# Patient Record
Sex: Female | Born: 1990 | Race: White | Hispanic: No | Marital: Single | Smoking: Current every day smoker
Health system: Southern US, Community
[De-identification: ages and names within clinical notes are randomized; demographics above are authoritative.]

## PROBLEM LIST (undated history)

## (undated) ENCOUNTER — Emergency Department (HOSPITAL_COMMUNITY): Payer: Medicaid Other

## (undated) DIAGNOSIS — F32A Depression, unspecified: Secondary | ICD-10-CM

## (undated) DIAGNOSIS — F329 Major depressive disorder, single episode, unspecified: Secondary | ICD-10-CM

## (undated) DIAGNOSIS — F39 Unspecified mood [affective] disorder: Secondary | ICD-10-CM

## (undated) DIAGNOSIS — F419 Anxiety disorder, unspecified: Secondary | ICD-10-CM

## (undated) DIAGNOSIS — E785 Hyperlipidemia, unspecified: Secondary | ICD-10-CM

## (undated) DIAGNOSIS — F603 Borderline personality disorder: Secondary | ICD-10-CM

## (undated) HISTORY — PX: OTHER SURGICAL HISTORY: SHX169

## (undated) HISTORY — PX: BLADDER SURGERY: SHX569

---

## 2009-12-26 ENCOUNTER — Emergency Department: Payer: Self-pay | Admitting: Emergency Medicine

## 2010-01-03 ENCOUNTER — Emergency Department: Payer: Self-pay | Admitting: Emergency Medicine

## 2010-01-17 ENCOUNTER — Ambulatory Visit: Payer: Self-pay | Admitting: Urology

## 2010-01-27 ENCOUNTER — Emergency Department: Payer: Self-pay | Admitting: Emergency Medicine

## 2010-02-24 ENCOUNTER — Emergency Department: Payer: Self-pay | Admitting: Emergency Medicine

## 2010-04-01 ENCOUNTER — Inpatient Hospital Stay: Payer: Self-pay | Admitting: Psychiatry

## 2010-05-08 ENCOUNTER — Emergency Department: Payer: Self-pay | Admitting: Emergency Medicine

## 2010-05-31 ENCOUNTER — Ambulatory Visit: Payer: Self-pay | Admitting: Urology

## 2010-06-09 ENCOUNTER — Emergency Department: Payer: Self-pay | Admitting: Emergency Medicine

## 2010-09-09 ENCOUNTER — Emergency Department: Payer: Self-pay | Admitting: Emergency Medicine

## 2010-11-23 ENCOUNTER — Inpatient Hospital Stay: Payer: Self-pay | Admitting: Psychiatry

## 2011-03-25 ENCOUNTER — Emergency Department (HOSPITAL_COMMUNITY)
Admission: EM | Admit: 2011-03-25 | Discharge: 2011-03-25 | Disposition: A | Discharge status: Home / Self Care | Payer: Medicaid Other | Attending: Emergency Medicine | Admitting: Emergency Medicine

## 2011-03-25 DIAGNOSIS — J45909 Unspecified asthma, uncomplicated: Secondary | ICD-10-CM | POA: Insufficient documentation

## 2011-03-25 DIAGNOSIS — S61509A Unspecified open wound of unspecified wrist, initial encounter: Secondary | ICD-10-CM | POA: Insufficient documentation

## 2011-03-25 DIAGNOSIS — X789XXA Intentional self-harm by unspecified sharp object, initial encounter: Secondary | ICD-10-CM | POA: Insufficient documentation

## 2011-03-25 DIAGNOSIS — F17200 Nicotine dependence, unspecified, uncomplicated: Secondary | ICD-10-CM | POA: Insufficient documentation

## 2011-03-25 DIAGNOSIS — Z79899 Other long term (current) drug therapy: Secondary | ICD-10-CM | POA: Insufficient documentation

## 2011-03-25 LAB — CBC
HCT: 43 % (ref 36.0–46.0)
Hemoglobin: 14 g/dL (ref 12.0–15.0)
MCH: 28.2 pg (ref 26.0–34.0)
MCHC: 32.6 g/dL (ref 30.0–36.0)
MCV: 86.5 fL (ref 78.0–100.0)

## 2011-03-25 LAB — COMPREHENSIVE METABOLIC PANEL
AST: 18 U/L (ref 0–37)
Albumin: 3.8 g/dL (ref 3.5–5.2)
Alkaline Phosphatase: 96 U/L (ref 39–117)
Chloride: 108 mEq/L (ref 96–112)
Creatinine, Ser: 0.81 mg/dL (ref 0.4–1.2)
Potassium: 3.5 mEq/L (ref 3.5–5.1)
Total Bilirubin: 0.1 mg/dL — ABNORMAL LOW (ref 0.3–1.2)

## 2011-03-25 LAB — DIFFERENTIAL
Lymphocytes Relative: 26 % (ref 12–46)
Monocytes Absolute: 0.5 10*3/uL (ref 0.1–1.0)
Monocytes Relative: 6 % (ref 3–12)
Neutro Abs: 6.1 10*3/uL (ref 1.7–7.7)

## 2011-03-25 LAB — RAPID URINE DRUG SCREEN, HOSP PERFORMED
Barbiturates: NOT DETECTED
Tetrahydrocannabinol: NOT DETECTED

## 2011-05-11 ENCOUNTER — Emergency Department: Payer: Self-pay | Admitting: Emergency Medicine

## 2011-05-13 ENCOUNTER — Ambulatory Visit: Payer: Self-pay | Admitting: Urology

## 2011-05-15 LAB — PATHOLOGY REPORT

## 2011-05-22 ENCOUNTER — Emergency Department: Payer: Self-pay | Admitting: Emergency Medicine

## 2011-08-05 ENCOUNTER — Emergency Department (HOSPITAL_COMMUNITY)
Admission: EM | Admit: 2011-08-05 | Discharge: 2011-08-06 | Payer: Medicaid Other | Attending: Emergency Medicine | Admitting: Emergency Medicine

## 2011-08-05 DIAGNOSIS — S61509A Unspecified open wound of unspecified wrist, initial encounter: Secondary | ICD-10-CM | POA: Insufficient documentation

## 2011-08-05 DIAGNOSIS — X58XXXA Exposure to other specified factors, initial encounter: Secondary | ICD-10-CM | POA: Insufficient documentation

## 2011-08-07 ENCOUNTER — Emergency Department (HOSPITAL_COMMUNITY)
Admission: EM | Admit: 2011-08-07 | Discharge: 2011-08-07 | Disposition: A | Discharge status: Home / Self Care | Payer: Medicaid Other | Attending: Emergency Medicine | Admitting: Emergency Medicine

## 2011-08-07 DIAGNOSIS — F39 Unspecified mood [affective] disorder: Secondary | ICD-10-CM | POA: Insufficient documentation

## 2011-08-07 DIAGNOSIS — E785 Hyperlipidemia, unspecified: Secondary | ICD-10-CM | POA: Insufficient documentation

## 2011-08-07 DIAGNOSIS — Z046 Encounter for general psychiatric examination, requested by authority: Secondary | ICD-10-CM | POA: Insufficient documentation

## 2011-08-07 DIAGNOSIS — E669 Obesity, unspecified: Secondary | ICD-10-CM | POA: Insufficient documentation

## 2011-08-07 DIAGNOSIS — F1910 Other psychoactive substance abuse, uncomplicated: Secondary | ICD-10-CM | POA: Insufficient documentation

## 2011-08-07 DIAGNOSIS — J45909 Unspecified asthma, uncomplicated: Secondary | ICD-10-CM | POA: Insufficient documentation

## 2011-08-07 DIAGNOSIS — F603 Borderline personality disorder: Secondary | ICD-10-CM | POA: Insufficient documentation

## 2011-08-07 LAB — DIFFERENTIAL
Basophils Relative: 0 % (ref 0–1)
Eosinophils Absolute: 0 10*3/uL (ref 0.0–0.7)
Eosinophils Relative: 1 % (ref 0–5)
Monocytes Relative: 7 % (ref 3–12)
Neutrophils Relative %: 70 % (ref 43–77)

## 2011-08-07 LAB — COMPREHENSIVE METABOLIC PANEL
ALT: 36 U/L — ABNORMAL HIGH (ref 0–35)
AST: 24 U/L (ref 0–37)
Albumin: 3.6 g/dL (ref 3.5–5.2)
CO2: 23 mEq/L (ref 19–32)
Calcium: 9.6 mg/dL (ref 8.4–10.5)
Sodium: 139 mEq/L (ref 135–145)
Total Protein: 7.2 g/dL (ref 6.0–8.3)

## 2011-08-07 LAB — RAPID URINE DRUG SCREEN, HOSP PERFORMED
Amphetamines: NOT DETECTED
Benzodiazepines: NOT DETECTED
Cocaine: NOT DETECTED

## 2011-08-07 LAB — POCT PREGNANCY, URINE: Preg Test, Ur: NEGATIVE

## 2011-08-07 LAB — CBC
MCH: 28.7 pg (ref 26.0–34.0)
MCHC: 32.7 g/dL (ref 30.0–36.0)
Platelets: 261 10*3/uL (ref 150–400)
RBC: 4.87 MIL/uL (ref 3.87–5.11)
RDW: 13.2 % (ref 11.5–15.5)

## 2011-08-08 ENCOUNTER — Emergency Department (HOSPITAL_COMMUNITY)
Admission: EM | Admit: 2011-08-08 | Discharge: 2011-08-08 | Disposition: A | Discharge status: Home / Self Care | Payer: Medicaid Other | Attending: Emergency Medicine | Admitting: Emergency Medicine

## 2011-08-08 DIAGNOSIS — X58XXXA Exposure to other specified factors, initial encounter: Secondary | ICD-10-CM | POA: Insufficient documentation

## 2011-08-08 DIAGNOSIS — S61509A Unspecified open wound of unspecified wrist, initial encounter: Secondary | ICD-10-CM | POA: Insufficient documentation

## 2011-08-08 DIAGNOSIS — Z09 Encounter for follow-up examination after completed treatment for conditions other than malignant neoplasm: Secondary | ICD-10-CM | POA: Insufficient documentation

## 2011-08-11 ENCOUNTER — Emergency Department (HOSPITAL_COMMUNITY)
Admission: EM | Admit: 2011-08-11 | Discharge: 2011-08-12 | Disposition: A | Discharge status: Nursing Home (Includes ICF) | Payer: Medicaid Other | Source: Home / Self Care | Attending: Emergency Medicine | Admitting: Emergency Medicine

## 2011-08-11 DIAGNOSIS — Z79899 Other long term (current) drug therapy: Secondary | ICD-10-CM | POA: Insufficient documentation

## 2011-08-11 DIAGNOSIS — T191XXA Foreign body in bladder, initial encounter: Secondary | ICD-10-CM | POA: Insufficient documentation

## 2011-08-11 DIAGNOSIS — T190XXA Foreign body in urethra, initial encounter: Secondary | ICD-10-CM | POA: Insufficient documentation

## 2011-08-11 DIAGNOSIS — E785 Hyperlipidemia, unspecified: Secondary | ICD-10-CM | POA: Insufficient documentation

## 2011-08-11 DIAGNOSIS — IMO0002 Reserved for concepts with insufficient information to code with codable children: Secondary | ICD-10-CM | POA: Insufficient documentation

## 2011-08-11 LAB — URINALYSIS, ROUTINE W REFLEX MICROSCOPIC
Glucose, UA: NEGATIVE mg/dL
Specific Gravity, Urine: 1.018 (ref 1.005–1.030)
pH: 6.5 (ref 5.0–8.0)

## 2011-08-11 LAB — URINE MICROSCOPIC-ADD ON

## 2011-08-12 ENCOUNTER — Inpatient Hospital Stay (HOSPITAL_COMMUNITY)
Admission: AD | Admit: 2011-08-12 | Discharge: 2011-08-17 | DRG: 700 | Disposition: A | Discharge status: Other Acute Inpatient Hospital | Payer: Medicaid Other | Source: Other Acute Inpatient Hospital | Attending: Urology | Admitting: Urology

## 2011-08-12 ENCOUNTER — Other Ambulatory Visit: Payer: Self-pay | Admitting: Urology

## 2011-08-12 DIAGNOSIS — F3112 Bipolar disorder, current episode manic without psychotic features, moderate: Secondary | ICD-10-CM

## 2011-08-12 DIAGNOSIS — IMO0002 Reserved for concepts with insufficient information to code with codable children: Secondary | ICD-10-CM | POA: Diagnosis present

## 2011-08-12 DIAGNOSIS — J45909 Unspecified asthma, uncomplicated: Secondary | ICD-10-CM | POA: Diagnosis present

## 2011-08-12 DIAGNOSIS — F603 Borderline personality disorder: Secondary | ICD-10-CM | POA: Diagnosis present

## 2011-08-12 DIAGNOSIS — E785 Hyperlipidemia, unspecified: Secondary | ICD-10-CM | POA: Diagnosis present

## 2011-08-12 DIAGNOSIS — T190XXA Foreign body in urethra, initial encounter: Principal | ICD-10-CM | POA: Diagnosis present

## 2011-08-12 DIAGNOSIS — F489 Nonpsychotic mental disorder, unspecified: Secondary | ICD-10-CM | POA: Diagnosis present

## 2011-08-12 HISTORY — DX: Major depressive disorder, single episode, unspecified: F32.9

## 2011-08-12 HISTORY — DX: Depression, unspecified: F32.A

## 2011-08-12 HISTORY — DX: Hyperlipidemia, unspecified: E78.5

## 2011-08-12 LAB — MRSA PCR SCREENING: MRSA by PCR: NEGATIVE

## 2011-08-13 LAB — LIPID PANEL
LDL Cholesterol: 137 mg/dL — ABNORMAL HIGH (ref 0–99)
Total CHOL/HDL Ratio: 4.9 RATIO
Triglycerides: 55 mg/dL (ref <150)
VLDL: 11 mg/dL (ref 0–40)

## 2011-08-14 NOTE — Consult Note (Signed)
Cynthia Phelps, LEANOS NO.:  [id number]  MEDICAL RECORD NO.:  [id number]  LOCATION:  1415                         FACILITY:  [facility]  PHYSICIAN:  Eulogio Ditch, MD DATE OF BIRTH:  [birth year]  DATE OF CONSULTATION:  08/12/2011 DATE OF DISCHARGE:                                CONSULTATION   REASON FOR CONSULT:  Psych history, the patient put foreign body into the bladder.  HISTORY OF PRESENT ILLNESS:  A 20 year old, Caucasian female, who has a history of borderline personality disorder and bipolar disorder.  The patient became upset with her father last night and pushed a cap of the marker through her urethra into her bladder.  The patient reported that she wanted to go to home for the weekend as she lives in a group home and father did not want to take her so that is why she became upset. The patient was seen at [facility] on August 06, 2011, they recommended inpatient psych hospitalization at [facility].  The patient verbalized suicidal ideation at that time, with a plan to overdose, to cut her wrist, or insert a foreign object into her bladder and later on, she did that.  The patient is on Clozaril 200 mg, but she is noncompliant with this medication for almost 3 weeks and she told me that she does not like this medication as it makes her sleepy.  The patient also takes Celexa 20 mg p.o. daily.  PAST MEDICAL HISTORY: 1. History of asthma. 2. Hyperlipidemia.  ALLERGIES:  The patient is allergic to; 1. LATEX. 2. HALDOL. 3. DOXYCYCLINE. 4. TETRACYCLINES.  SUBSTANCE ABUSE HISTORY:  The patient has a history of polysubstance dependence, but currently reported that she is sober from the drugs and alcohol for almost 1 year.  PAST PSYCHIATRIC HISTORY:  The patient has multiple admissions in the past.  She was in the [facility] 2 months ago for almost 2 months.  The patient has been in the psych hospital since the age of 83.  Currently, she is followed  at [facility] for her psychiatric/mental health issues.  MENTAL STATUS EXAMINATION:  The patient is calm, cooperative during interview.  Fair eye contact.  No abnormal movements noticed.  Speech normal in rate, rhythm, and volume.  Mood, depressed as per her affect. Mood, congruent.  Thought process; she is logical and goal-directed, not hallucinating or delusional.  Not internally preoccupied.  She denies currently being suicidal or homicidal.  Cognition; alert, awake, and oriented x3.  Memory; immediate, recent, remote fair.  Attention and concentration, fair.  Abstraction ability, fair.  Insight and judgment, poor.  DIAGNOSES:  Axis I:  Mood disorder, NOS.  Rule out bipolar disorder. Axis II:  Borderline personality disorder.  Axis III:  See medical notes. Axis IV:  Chronic mental health issues. Axis V:  40-50.  RECOMMENDATIONS: 1. As the patient is noncompliant with Clozaril, I will not start her     back on this medication and she do not want to be on this     medication.  The patient agreed to take Geodon 40 mg twice a day.    Geodon will be a good medication for  her because of less weight     gain as compared to other medications in her case.  The patient     also wants to move to the inpatient psych hospital at [facility], but I     told her that I will start the medication and we will see how she     does on that and also in the meanwhile, we will refer her to the     [facility].  The patient agrees with this treatment plan. 2. Social worker should call the group home to get more collateral     information on the patient.     Eulogio Ditch, MD     SA/MEDQ  D:  08/12/2011  T:  08/12/2011  Job:  [id number]  Electronically Signed by Eulogio Ditch  on 08/14/2011 12:13:01 PM

## 2011-08-16 DIAGNOSIS — F39 Unspecified mood [affective] disorder: Secondary | ICD-10-CM

## 2011-08-16 DIAGNOSIS — F603 Borderline personality disorder: Secondary | ICD-10-CM

## 2011-08-16 MED ORDER — LORAZEPAM 1 MG PO TABS: 2.0000 mg | ORAL_TABLET | Freq: Every day | ORAL | Status: DC

## 2011-08-16 MED ORDER — CITALOPRAM HYDROBROMIDE 20 MG PO TABS
20.0000 mg | ORAL_TABLET | Freq: Every day | ORAL | Status: DC
  Administered 2011-08-17: 20 mg via ORAL
  Filled 2011-08-16 (×3): qty 1

## 2011-08-16 MED ORDER — MORPHINE SULFATE 2 MG/ML IJ SOLN: 2.0000 mg | INTRAMUSCULAR | Status: DC | PRN

## 2011-08-16 MED ORDER — CIPROFLOXACIN HCL 500 MG PO TABS
500.0000 mg | ORAL_TABLET | Freq: Two times a day (BID) | ORAL | Status: DC
  Administered 2011-08-17: 500 mg via ORAL
  Filled 2011-08-16 (×2): qty 1

## 2011-08-16 MED ORDER — DOCUSATE SODIUM 100 MG PO CAPS
100.0000 mg | ORAL_CAPSULE | Freq: Two times a day (BID) | ORAL | Status: DC
  Administered 2011-08-17: 100 mg via ORAL
  Filled 2011-08-16 (×6): qty 1

## 2011-08-16 MED ORDER — NICOTINE 14 MG/24HR TD PT24
14.0000 mg | MEDICATED_PATCH | Freq: Every day | TRANSDERMAL | Status: DC
  Filled 2011-08-16 (×2): qty 1

## 2011-08-16 MED ORDER — TIOTROPIUM BROMIDE MONOHYDRATE 18 MCG IN CAPS
18.0000 ug | ORAL_CAPSULE | Freq: Every day | RESPIRATORY_TRACT | Status: DC
  Administered 2011-08-17: 18 ug via RESPIRATORY_TRACT
  Filled 2011-08-16: qty 5

## 2011-08-16 MED ORDER — NICOTINE 7 MG/24HR TD PT24
7.0000 mg | MEDICATED_PATCH | Freq: Every day | TRANSDERMAL | Status: DC
  Administered 2011-08-17: 7 mg via TRANSDERMAL
  Filled 2011-08-16 (×3): qty 1

## 2011-08-16 MED ORDER — ZIPRASIDONE HCL 40 MG PO CAPS
40.0000 mg | ORAL_CAPSULE | Freq: Two times a day (BID) | ORAL | Status: DC
  Administered 2011-08-17 (×2): 40 mg via ORAL
  Filled 2011-08-16 (×5): qty 1

## 2011-08-16 MED ORDER — ASPIRIN EC 81 MG PO TBEC
81.0000 mg | DELAYED_RELEASE_TABLET | Freq: Every day | ORAL | Status: DC
  Administered 2011-08-17: 81 mg via ORAL
  Filled 2011-08-16 (×3): qty 1

## 2011-08-16 MED ORDER — PHENAZOPYRIDINE HCL 100 MG PO TABS
100.0000 mg | ORAL_TABLET | Freq: Three times a day (TID) | ORAL | Status: DC | PRN
  Filled 2011-08-16: qty 1

## 2011-08-16 NOTE — Consult Note (Signed)
Cynthia Phelps, Cynthia Phelps NO.:  [id number]  MEDICAL RECORD NO.:  [id number]  LOCATION:  1415                         FACILITY:  [facility]  PHYSICIAN:  Franchot Gallo, MD     DATE OF BIRTH:  1991/01/27  DATE OF CONSULTATION:  08/16/2011 DATE OF DISCHARGE:                                CONSULTATION   CHIEF COMPLAINT:  "I am here because I stuck something in my bladder."  HISTORY OF PRESENT ILLNESS:  Cynthia Phelps is a 20 year old, single, [place] female, who was admitted to [facility] after inserting the tip of a marker through her urethra into her bladder.  The patient states that did this secondary to being angry with her father.  The patient states that she currently lives at a group home by the name of [facility] and enjoys living at this facility.  She reports that she has been "cutting herself" for many years and cannot recall when she began to insert items into her bladder.  She states that she does this out of frustration and that the pain associated with either cutting herself or inserting items into her urethra helps her with her anxiety. She denies obtaining sexual gratification from inserting the items.  Currently, the patient states that she is sleeping well without difficulty and reports a good appetite.  She states that she has some mild feelings of sadness, anhedonia, and depressed mood related to "being in the hospital."  She states that on a scale of 1 to 10, she would rate her depression as a 2.  She adamantly denies any suicidal or homicidal ideations today.  She reports one past suicide gesture by overdosing on medications when she was approximately 20 years of age. She reports since that time she has had many incidences of "scratching or cutting myself" as well as inserting items into her bladder, but at no point in time was she wanting to kill herself.  The patient also denies any auditory or visual hallucinations.  The patient  denies any significant anxiety symptoms.  She also denies any panic symptoms.  The patient reports she has been given the diagnosis of a bipolar disorder as well as the diagnosis of a borderline personality disorder.  The patient, however, does not describe any significant manic symptoms either currently or in the past stating that she will become angry very easily.  She reports that in the past when she was off antipsychotic medications, she did at times become suicidal. However, with her medications, she again adamantly denies any suicidal or homicidal thoughts.  The patient denies any use of alcohol or illicit drugs and states that she smokes one-half pack of cigarettes per day.  Mental Health was consulted since the patient recently broke the tip of a fork off and inserted it into her urethra.  She states again that she did this out of frustration from being in the hospital rather than any attempt to harm herself.  PAST PSYCHIATRIC HISTORY:  The patient reports numerous past psychiatric hospitalizations since the age of 31.  She reports to recently being hospitalized at [facility] approximately 8 weeks ago. She is currently being followed  at [facility] for psychiatric and mental health care.  PAST MEDICAL HISTORY:  CURRENT MEDICATIONS: 1. Aspirin 81 mg p.o. q. a.m. 2. Cipro 500 mg p.o. q. 8:00 a.m. and 8:00 p.m. 3. Celexa 20 mg p.o. q. a.m. 4. Colace 100 mg p.o. b.i.d. 5. Ativan 2 mg p.o. at bedtime. 6. Nicotine patch 14 mg per 24 hours. 7. Spiriva 18 mcg IH daily. 8. Geodon 40 mg p.o. b.i.d. with food.  ALLERGIES: 1. Doxycyline - rash. 2. Tetracyline - rash. 3. Haldol - quadriparesis and tongue sticks.  MEDICAL ILLNESSES: 1. History of asthma. 2. Hyperlipidemia. 3. Status post cystoscopy for removal of foreign body.  FAMILY HISTORY:  The patient reports that she has one sister who is 77 years of age, who is in good health.  She denies any other  family history of psychiatric or substance abuse related illnesses.  SOCIAL HISTORY:  The patient states that she was born in [place] and currently lives at the [facility] [place]  The patient is very satisfied with this placement.  She is denying any abuse of alcohol or illicit drugs, but does report smoking approximately one-half pack of cigarettes per day.  The patient is single and has never married and has no children.  MENTAL STATUS EXAM:  GENERAL:  The patient was alert and oriented x3 and was friendly and cooperative with this provider.  Speech was appropriate in terms of rate and volume.  Mood appeared essentially euthymic. Affect was bright and full.  Thoughts - the patient denied any obvious delusions or hallucinations nor did she report any suicidal or homicidal ideas.  Judgment and insight today both appeared fair.  IMPRESSION:    Axis I - Mood disorder - NOS - with depressive symptoms - mild.    Axis II - Borderline personality disorder.     Axis III - Please see medical history above.    Axis IV - Chronic personality disorder.    Axis V - GAF at time of admission is approximately 35.  Current GAF     approximately 65.  PLAN: 1.  It is recommended that the patient remain on her current medication     Celexa 20 mg q. a.m. to continue to address her depression and     anxiety. 2.  It is also recommended the patient remain on the medication Geodon     at 40 mg p.o. b.i.d. to provide mood stabilization, which may improve      her self-injurious behavior.  The patient also states that she does not      experience any suicidal thoughts when taking antipsychotic medications. 3.  Although it has previously been recommended that the patient go to      [facility] for further treatment, at this point in time      I do not see the benefit of this transfer.  The patient is not reporting     any suicidal ideations.  She  states she harms herself when she is frustrated.     She states that the most recent episode of inserting the fork tip into her      bladder once again to relieve stress of being in the hospital.  This self injurious     behavior is common in individuals with a Borderline Personality Disorder.     The more helpful therapy for this disorder is DBT and can be performed on an      outpatient basis.  It seems most appropriate for the patient to be discharged      back to her group home with close follow-up aranged with her mental health      providers. 4.  It is recommended that the patient's social worker contact the group home where      the lives and discuss if she is allowed to return to this facility.   5.  I would also recommend that this patient be consulted one last time once     discharge plans have been made to again assure that no change in     her mental status has occurred between this consult and the time of     discharge.  I really appreciate the opportunity to assist you with the care of this patient.    __________________________________ Franchot Gallo, MD     RR/MEDQ  D:  08/16/2011  T:  08/16/2011  Job:  [id number]  Electronically Signed by Franchot Gallo MD on 08/16/2011 10:47:08 PM

## 2011-08-17 ENCOUNTER — Encounter (HOSPITAL_COMMUNITY): Payer: Self-pay | Admitting: *Deleted

## 2011-08-17 ENCOUNTER — Other Ambulatory Visit: Payer: Self-pay | Admitting: Urology

## 2011-08-17 ENCOUNTER — Inpatient Hospital Stay (HOSPITAL_COMMUNITY)
Admission: RE | Admit: 2011-08-17 | Discharge: 2011-08-22 | DRG: 885 | Disposition: A | Discharge status: Home / Self Care | Payer: Medicaid Other | Source: Ambulatory Visit | Attending: Psychiatry | Admitting: Psychiatry

## 2011-08-17 ENCOUNTER — Encounter (HOSPITAL_COMMUNITY): Payer: Self-pay

## 2011-08-17 DIAGNOSIS — J45909 Unspecified asthma, uncomplicated: Secondary | ICD-10-CM

## 2011-08-17 DIAGNOSIS — F17200 Nicotine dependence, unspecified, uncomplicated: Secondary | ICD-10-CM

## 2011-08-17 DIAGNOSIS — Z7982 Long term (current) use of aspirin: Secondary | ICD-10-CM

## 2011-08-17 DIAGNOSIS — E785 Hyperlipidemia, unspecified: Secondary | ICD-10-CM

## 2011-08-17 DIAGNOSIS — F39 Unspecified mood [affective] disorder: Principal | ICD-10-CM

## 2011-08-17 DIAGNOSIS — Z79899 Other long term (current) drug therapy: Secondary | ICD-10-CM

## 2011-08-17 DIAGNOSIS — F603 Borderline personality disorder: Secondary | ICD-10-CM

## 2011-08-17 DIAGNOSIS — G4700 Insomnia, unspecified: Secondary | ICD-10-CM

## 2011-08-17 DIAGNOSIS — N390 Urinary tract infection, site not specified: Secondary | ICD-10-CM

## 2011-08-17 DIAGNOSIS — Z7289 Other problems related to lifestyle: Secondary | ICD-10-CM | POA: Diagnosis present

## 2011-08-17 HISTORY — DX: Borderline personality disorder: F60.3

## 2011-08-17 MED ORDER — ZIPRASIDONE HCL 40 MG PO CAPS
40.0000 mg | ORAL_CAPSULE | Freq: Two times a day (BID) | ORAL | Status: DC
  Administered 2011-08-18 – 2011-08-22 (×9): 40 mg via ORAL
  Filled 2011-08-17 (×6): qty 1
  Filled 2011-08-17: qty 14
  Filled 2011-08-17 (×3): qty 1
  Filled 2011-08-17: qty 14
  Filled 2011-08-17 (×2): qty 1
  Filled 2011-08-17 (×2): qty 14

## 2011-08-17 MED ORDER — NICOTINE 7 MG/24HR TD PT24
7.0000 mg | MEDICATED_PATCH | Freq: Every day | TRANSDERMAL | Status: DC
  Administered 2011-08-18 – 2011-08-22 (×5): 7 mg via TRANSDERMAL
  Filled 2011-08-17 (×8): qty 1

## 2011-08-17 MED ORDER — DOCUSATE SODIUM 100 MG PO CAPS
100.0000 mg | ORAL_CAPSULE | Freq: Two times a day (BID) | ORAL | Status: DC
  Administered 2011-08-17 – 2011-08-22 (×10): 100 mg via ORAL
  Filled 2011-08-17 (×15): qty 1

## 2011-08-17 MED ORDER — ASPIRIN EC 81 MG PO TBEC
81.0000 mg | DELAYED_RELEASE_TABLET | Freq: Every day | ORAL | Status: DC
  Administered 2011-08-18 – 2011-08-22 (×5): 81 mg via ORAL
  Filled 2011-08-17 (×7): qty 1

## 2011-08-17 MED ORDER — PHENAZOPYRIDINE HCL 100 MG PO TABS: 100.0000 mg | ORAL_TABLET | Freq: Three times a day (TID) | ORAL | Status: DC | PRN

## 2011-08-17 MED ORDER — CIPROFLOXACIN HCL 500 MG PO TABS
500.0000 mg | ORAL_TABLET | Freq: Two times a day (BID) | ORAL | Status: AC
  Administered 2011-08-18: 500 mg via ORAL
  Filled 2011-08-17: qty 1

## 2011-08-17 MED ORDER — LORAZEPAM 1 MG PO TABS
2.0000 mg | ORAL_TABLET | Freq: Every day | ORAL | Status: DC
  Administered 2011-08-17 – 2011-08-21 (×5): 2 mg via ORAL
  Filled 2011-08-17 (×5): qty 2

## 2011-08-17 MED ORDER — TIOTROPIUM BROMIDE MONOHYDRATE 18 MCG IN CAPS
18.0000 ug | ORAL_CAPSULE | Freq: Every day | RESPIRATORY_TRACT | Status: DC
  Administered 2011-08-18 – 2011-08-22 (×5): 18 ug via RESPIRATORY_TRACT
  Filled 2011-08-17: qty 5

## 2011-08-17 MED ORDER — CITALOPRAM HYDROBROMIDE 20 MG PO TABS
20.0000 mg | ORAL_TABLET | Freq: Every day | ORAL | Status: DC
  Administered 2011-08-18 – 2011-08-22 (×5): 20 mg via ORAL
  Filled 2011-08-17 (×5): qty 1
  Filled 2011-08-17 (×2): qty 7
  Filled 2011-08-17: qty 1

## 2011-08-17 NOTE — Progress Notes (Signed)
  Discussed patient with Dr. Allena Katz. He agrees to accept patient in transfer to [facility]al [facility]-hall for further treatment and disposition planning.

## 2011-08-17 NOTE — Progress Notes (Signed)
Message from Dr. Gearldine Shown stating that the psych MD, Dr. Allena Katz, states that Pt is ok to return to group home.  Dr. Gearldine Shown states that he's in agreement with this, however he'd like for Pt to go to [facility] for tx prior to returning to the group home.  T/c with Tom at [facility].  Elijah B[facility] is aware of the situation with Pt and states that they do not have a bed on the 400 hall, where Pt would be most appropriate, at this time.  Elijah Birk will contact CSW if a bed become available today.

## 2011-08-17 NOTE — Progress Notes (Signed)
Assessment Note   Cynthia Phelps is an 20 y.o. female, who was admitted at [facility] after she inserted the tip of a marker through her urethra into her bladder. Patient reported that she has been cutting herself for many years, and has also been inserting items into her bladder. Patient reported that out of frustration and that the pain associated with cutting herself or inserting items into her urethra helps her with her anxiety. Pt reports being negative SI/HI, no AH/VH noted. Pt reported several past psychiatric hospitalizations since the age of 29. Pt reported that she was recently admitted to [facility] 2 months ago and that she there for two months.   Axis I: Mood disorder Axis II: Borderline Personality Dis. Axis III:  Past Medical History  Diagnosis Date  . Asthma   . Mental disorder   . Depression   . Hyperlipidemia, acquired    Axis IV: other psychosocial or environmental problems Axis V: 31-40 impairment in reality testing  Past Medical History:  Past Medical History  Diagnosis Date  . Asthma   . Mental disorder   . Depression   . Hyperlipidemia, acquired     Past Surgical History  Procedure Date  . Open cystotomy   . No past surgeries     Family History: No family history on file.  Social History:  reports that she has been smoking Cigarettes.  She has been smoking about .5 packs per day. She uses smokeless tobacco. She reports that she does not drink alcohol or use illicit drugs.  Allergies:  Allergies  Allergen Reactions  . Haldol (Haloperidol Decanoate) Other (See Comments)    Freezes up and tongue sticks out  . Doxycycline Rash  . Tetracyclines & Related Rash    Home Medications:  Medications Prior to Admission  Medication Dose Route Frequency Provider Last Rate Last Dose  . aspirin EC tablet 81 mg  81 mg Oral Daily Antony Haste, MD   81 mg at 08/17/11 1019  . ciprofloxacin (CIPRO) tablet 500 mg  500 mg Oral BID Antony Haste, MD   500 mg at  08/17/11 0827  . citalopram (CELEXA) tablet 20 mg  20 mg Oral Daily Antony Haste, MD   20 mg at 08/17/11 1020  . docusate sodium (COLACE) capsule 100 mg  100 mg Oral BID Antony Haste, MD   100 mg at 08/17/11 1019  . LORazepam (ATIVAN) tablet 2 mg  2 mg Oral QHS Antony Haste, MD      . nicotine (NICODERM CQ - dosed in mg/24 hr) patch 7 mg  7 mg Transdermal Daily Emeline Gins, PHARMD   7 mg at 08/17/11 1019  . phenazopyridine (PYRIDIUM) tablet 100 mg  100 mg Oral TID PRN Antony Haste, MD      . tiotropium [facility]) inhalation capsule 18 mcg  18 mcg Inhalation Daily Antony Haste, MD   18 mcg at 08/17/11 0804  . ziprasidone (GEODON) capsule 40 mg  40 mg Oral BID WC Antony Haste, MD   40 mg at 08/17/11 0827  . DISCONTD: morphine 2 MG/ML injection 2 mg  2 mg Intravenous Q2H PRN Antony Haste, MD      . DISCONTD: nicotine (NICODERM CQ - dosed in mg/24 hours) patch 14 mg  14 mg Transdermal Daily Antony Haste, MD       Medications Prior to Admission  Medication Sig Dispense Refill  . albuterol (PROVENTIL HFA;VENTOLIN HFA) 108 (90 BASE) MCG/ACT  inhaler Inhale 2 puffs into the lungs every 4 (four) hours as needed. For shortness of breath       . aspirin EC 81 MG tablet Take 81 mg by mouth daily.        . citalopram (CELEXA) 40 MG tablet Take 40 mg by mouth every morning.        . clozapine (CLOZARIL) 200 MG tablet Take 200 mg by mouth daily.        . fish oil-omega-3 fatty acids 1000 MG capsule Take 1 g by mouth daily.        . simvastatin (ZOCOR) 20 MG tablet Take 20 mg by mouth daily.        Marland Kitchen tiotropium (SPIRIVA) 18 MCG inhalation capsule Place 18 mcg into inhaler and inhale daily.          OB/GYN Status:  No LMP recorded.  General Assessment Data Living Arrangements: Group Home Can pt return to current living arrangement?: Yes Admission Status: Voluntary Is patient capable of signing voluntary  admission?: Yes Transfer from: Acute Hospital Referral Source: Medical Floor Inpatient  Risk to self Suicidal Ideation: No Suicidal Intent: Yes-Currently Present Is patient at risk for suicide?: Yes Suicidal Plan?: No Access to Means: Yes Specify Access to Suicidal Means: patient put metallic fork piece into her bladder requiring surgery What has been your use of drugs/alcohol within the last 12 months?: none noted Other Self Harm Risks: none noted Triggers for Past Attempts: None known Intentional Self Injurious Behavior: Damaging (putting objects into her urethra) Comment - Self Injurious Behavior: self mutilation tendencies Factors that decrease suicide risk: Positive social support Family Suicide History: No Recent stressful life event(s): Other (Comment) (none noted) Persecutory voices/beliefs?: No Depression: No Depression Symptoms:  (none noted) Substance abuse history and/or treatment for substance abuse?: Yes Suicide prevention information given to non-admitted patients: Not applicable  Risk to Others Homicidal Ideation: No Thoughts of Harm to Others: No Current Homicidal Intent: No Current Homicidal Plan: No Access to Homicidal Means: No Identified Victim: none noted History of harm to others?: No Assessment of Violence: None Noted Violent Behavior Description: none noted Does patient have access to weapons?: No Criminal Charges Pending?: No Does patient have a court date: No  Mental Status Report Appear/Hygiene: Other (Comment) (did not see patient) Eye Contact: Other (Comment) (did not see patient) Motor Activity: Unable to assess Speech: Unable to assess Level of Consciousness: Unable to assess Mood: Depressed Affect: Depressed Anxiety Level: None Judgement: Impaired Orientation: Unable to assess Obsessive Compulsive Thoughts/Behaviors: None  Cognitive Functioning Concentration:  (unknown) Memory:  (unknown) IQ:  (unknown) Insight: Poor Impulse  Control: Poor Appetite:  (unknown) Weight Loss:  (unknown) Weight Gain:  (unknown) Sleep:  (unknown) Total Hours of Sleep:  (unknown) Vegetative Symptoms: None  Prior Inpatient/Outpatient Therapy Prior Therapy:  (unknown) Prior Therapy Dates: unknown Prior Therapy Facilty/Provider(s): known Reason for Treatment: unknown  ADL Screening (condition at time of admission) Patient's cognitive ability adequate to safely complete daily activities?: Yes Patient able to express need for assistance with ADLs?: Yes Independently performs ADLs?: Yes Weakness of Legs: None Weakness of Arms/Hands: None  Home Assistive Devices/Equipment Home Assistive Devices/Equipment: None  Therapy Consults (therapy consults require a physician order) PT Evaluation Needed: No OT Evalulation Needed: No SLP Evaluation Needed: No Abuse/Neglect Assessment (Assessment to be complete while patient is alone) Physical Abuse: Denies Verbal Abuse: Denies Sexual Abuse: Denies Exploitation of patient/patient's resources: Denies Self-Neglect: Denies Values / Beliefs Cultural Requests During Hospitalization: None Spiritual  Requests During Hospitalization: None Consults Spiritual Care Consult Needed: No Social Work Consult Needed: No   Nutrition Screen Diet: Regular Unintentional weight loss greater than 10lbs within the last month: No Dysphagia: No Home Tube Feeding or Total Parenteral Nutrition (TPN): No Patient appears severely malnourished: No Pregnant or Lactating: No Dietitian Consult Needed: No  Additional Information 1:1 In Past 12 Months?: No Elopement Risk: No Does patient have medical clearance?: Yes  Child/Adolescent Assessment Running Away Risk: Denies Bed-Wetting: Denies Destruction of Property: Denies Cruelty to Animals: Denies Stealing: Denies Rebellious/Defies Authority: Denies Satanic Involvement: Denies Archivist: Denies Problems at [employer]: Denies Gang Involvement:  Denies  Disposition:  Disposition Disposition of Patient: Inpatient treatment program (pending) Type of inpatient treatment program: Adult  On Site Evaluation by:   Reviewed with Physician:     Buford Dresser 08/17/2011 4:51 PM

## 2011-08-17 NOTE — Progress Notes (Signed)
  Subjective: Patient reports no complaints. No dysuria.   Objective: Vital signs in last 24 hours: Temp:  [98.4 F (36.9 C)-98.6 F (37 C)] 98.6 F (37 C) (11/04 0644) Pulse Rate:  [64-68] 64  (11/04 0644) Resp:  [16-18] 18  (11/04 0644) BP: (103-143)/(67-75) 103/67 mmHg (11/04 0644) SpO2:  [97 %-98 %] 98 % (11/04 0806)  Intake/Output from previous day: 11/03 0701 - 11/04 0700 In: 580 [P.O.:580] Out: 900 [Urine:900]    Physical Exam:  Well appearing. Walking halls.    Assessment/Plan: Borderline personality Awaiting transfer to [facility]   LOS: 5 days   Antony Haste 08/17/2011, 9:53 AM

## 2011-08-17 NOTE — Progress Notes (Signed)
Transported to [facility] by security and Special educational needs teacher.  Patient belongings given to security.

## 2011-08-18 ENCOUNTER — Encounter (HOSPITAL_COMMUNITY): Payer: Self-pay | Admitting: Psychiatry

## 2011-08-18 DIAGNOSIS — F603 Borderline personality disorder: Secondary | ICD-10-CM

## 2011-08-18 DIAGNOSIS — F39 Unspecified mood [affective] disorder: Secondary | ICD-10-CM

## 2011-08-18 DIAGNOSIS — Z7289 Other problems related to lifestyle: Secondary | ICD-10-CM | POA: Diagnosis present

## 2011-08-18 NOTE — H&P (Addendum)
Cynthia Phelps is an 20 y.o. female.   Chief Complaint: Anxiety HPI: patient admitted for self mutilating behaviors by cutting and inserting objects into urethra. Patient states  this is due to anxiety and frustration. Denies any psychotic sx. Denies this as suicide attempt.  Past Medical History  Diagnosis Date  . Asthma   . Hyperlipidemia, acquired   . Borderline personality disorder   . Depression     Past Surgical History  Procedure Date  . Open cystotomy   . No past surgeries     History reviewed. No pertinent family history. Social History:  reports that she has been smoking Cigarettes.  She has been smoking about 2 packs per day. She uses smokeless tobacco. She reports that she does not drink alcohol or use illicit drugs.  Allergies:  Allergies  Allergen Reactions  . Haldol (Haloperidol Decanoate) Other (See Comments)    Freezes up and tongue sticks out  . Doxycycline Itching and Rash  . Tetracyclines & Related Itching and Rash    Medications Prior to Admission  Medication Dose Route Frequency Provider Last Rate Last Dose  . aspirin EC tablet 81 mg  81 mg Oral Daily Randy Readling, MD   81 mg at 08/18/11 0800  . ciprofloxacin (CIPRO) tablet 500 mg  500 mg Oral BID Franchot Gallo, MD   500 mg at 08/18/11 0950  . citalopram (CELEXA) tablet 20 mg  20 mg Oral Daily Franchot Gallo, MD   20 mg at 08/18/11 0951  . docusate sodium (COLACE) capsule 100 mg  100 mg Oral BID Franchot Gallo, MD   100 mg at 08/18/11 0800  . LORazepam (ATIVAN) tablet 2 mg  2 mg Oral QHS Franchot Gallo, MD   2 mg at 08/17/11 2256  . nicotine (NICODERM CQ - dosed in mg/24 hr) patch 7 mg  7 mg Transdermal Daily Franchot Gallo, MD   7 mg at 08/18/11 1153  . phenazopyridine (PYRIDIUM) tablet 100 mg  100 mg Oral TID PRN Franchot Gallo, MD      . tiotropium (SPIRIVA) inhalation capsule 18 mcg  18 mcg Inhalation Daily Franchot Gallo, MD   18 mcg at 08/18/11 0952  . ziprasidone (GEODON) capsule 40 mg  40 mg Oral  BID WC Franchot Gallo, MD   40 mg at 08/18/11 0949  . DISCONTD: aspirin EC tablet 81 mg  81 mg Oral Daily Antony Haste, MD   81 mg at 08/17/11 1019  . DISCONTD: ciprofloxacin (CIPRO) tablet 500 mg  500 mg Oral BID Antony Haste, MD   500 mg at 08/17/11 0827  . DISCONTD: citalopram (CELEXA) tablet 20 mg  20 mg Oral Daily Antony Haste, MD   20 mg at 08/17/11 1020  . DISCONTD: docusate sodium (COLACE) capsule 100 mg  100 mg Oral BID Antony Haste, MD   100 mg at 08/17/11 1019  . DISCONTD: LORazepam (ATIVAN) tablet 2 mg  2 mg Oral QHS Antony Haste, MD      . DISCONTD: morphine 2 MG/ML injection 2 mg  2 mg Intravenous Q2H PRN Antony Haste, MD      . DISCONTD: nicotine (NICODERM CQ - dosed in mg/24 hours) patch 14 mg  14 mg Transdermal Daily Antony Haste, MD      . DISCONTD: nicotine (NICODERM CQ - dosed in mg/24 hr) patch 7 mg  7 mg Transdermal Daily Emeline Gins, PHARMD   7 mg at 08/17/11 1019  . DISCONTD: phenazopyridine (PYRIDIUM)  tablet 100 mg  100 mg Oral TID PRN Antony Haste, MD      . DISCONTD: tiotropium [facility]) inhalation capsule 18 mcg  18 mcg Inhalation Daily Antony Haste, MD   18 mcg at 08/17/11 0804  . DISCONTD: ziprasidone (GEODON) capsule 40 mg  40 mg Oral BID WC Antony Haste, MD   40 mg at 08/17/11 1712   Medications Prior to Admission  Medication Sig Dispense Refill  . aspirin EC 81 MG tablet Take 81 mg by mouth daily.        . citalopram (CELEXA) 20 MG tablet Take 20 mg by mouth daily.        Marland Kitchen docusate sodium (COLACE) 100 MG capsule Take 100 mg by mouth 2 (two) times daily.        Marland Kitchen LORazepam (ATIVAN) 2 MG tablet Take 2 mg by mouth at bedtime.        Marland Kitchen tiotropium (SPIRIVA) 18 MCG inhalation capsule Place 18 mcg into inhaler and inhale daily.        . ziprasidone (GEODON) 80 MG capsule Take 80 mg by mouth 2 (two) times daily with a meal.        . albuterol  (PROVENTIL HFA;VENTOLIN HFA) 108 (90 BASE) MCG/ACT inhaler Inhale 2 puffs into the lungs every 4 (four) hours as needed. For shortness of breath       . fish oil-omega-3 fatty acids 1000 MG capsule Take 1 g by mouth daily.        . simvastatin (ZOCOR) 20 MG tablet Take 20 mg by mouth daily.          No results found for this or any previous visit (from the past 48 hour(s)). No results found.  Review of Systems  Constitutional: Negative.   HENT: Negative.   Eyes: Negative.   Respiratory: Positive for shortness of breath (asthma).   Cardiovascular: Negative.   Gastrointestinal: Negative.   Genitourinary: Positive for dysuria and urgency.  Musculoskeletal: Negative.   Skin: Negative.   Neurological: Negative.   Endo/Heme/Allergies: Negative.   Psychiatric/Behavioral: Positive for depression. The patient is nervous/anxious and has insomnia.     Blood pressure 136/76, pulse 106, temperature 98.2 F (36.8 C), temperature source Oral, resp. rate 20, height 5\' 1"  (1.549 m), weight 100.245 kg (221 lb). Physical Exam  Constitutional: She is oriented to person, place, and time. She appears well-developed and well-nourished.  HENT:  Head: Normocephalic.  Eyes: Pupils are equal, round, and reactive to light.  Neck: Normal range of motion. Neck supple.  Cardiovascular: Normal rate, regular rhythm and normal heart sounds.   Respiratory: Effort normal and breath sounds normal.  GI: Soft. Bowel sounds are normal.  Musculoskeletal: Normal range of motion.  Neurological: She is alert and oriented to person, place, and time.  Skin: Skin is warm and dry. Laceration (superficial to left wrist) noted.  Psychiatric: She has a normal mood and affect. Thought content normal.       Assessment/Plan Monitor patients behavior Monitor signs and symptoms of dysuria Increase coping skills    Mechele Dawley 08/18/2011, 1:41 PM

## 2011-08-18 NOTE — Plan of Care (Signed)
Problem: Diagnosis: Increased Risk For Suicide Attempt Goal: STG-Patient Will Participate in Any Family Sessions STG: Patient will participate in any family sessions arranged by counselor.  No family session indicated - contact will be made with father as allowed by patient.

## 2011-08-18 NOTE — Progress Notes (Signed)
Patient seen to assess for discharge planning needs. nme She currently denies SI/HI and depression.  She denies any suicide attempt and is requesting to discharge home.  She consented for writer to contact group home and outpatient  Provider.  Message left for group home provider and follow up scheduled.
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
## 2011-08-18 NOTE — Plan of Care (Signed)
Problem: Diagnosis: Increased Risk For Suicide Attempt Goal: STG-Patient Will Participate in Any Family Sessions STG: Patient will participate in any family sessions arranged by counselor.  No family session indicated - family contact to be made with father if pt will allow.
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
## 2011-08-18 NOTE — Progress Notes (Signed)
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   Axis II: Borderline Personality Disorder   The patient was seen today and reports the following:   ADL's: Intact  Sleep: Patient reports to sleeping well.  Appetite: Yes, AEB: Good Appetite.   Mild>(1-10) >Severe  Hopelessness (1-10): 0  Depression (1-10): 0  Anxiety (1-10): 0   Suicidal Ideation:  Adamantly denies any Suicidal Ideations or thoughts of self-harm.  Plan: No  Intent: No  Means: No  Homicidal Ideation: Adamantly denies any Homicidal Ideations.  Plan: No  Intent: No  Means: No  Mental Status: AO x 3  General Appearance /Behavior: Neat and Casual  Eye Contact: Good  Motor Behavior: Normal  Speech: Normal  Level of Consciousness: Alert  Mood: Euthymic  Affect: Bright and Cheerful Anxiety Level:  None reported Thought Process: Coherent  Thought Content: WNL  Perception: Normal  Judgment: Fair  Insight: Good  Cognition: Orientation time, place and person   Vital Signs:Blood pressure 136/76, pulse 106, temperature 98.2 F (36.8 C), temperature source Oral, resp. rate 20, height 5\' 1"  (1.549 m), weight 100.245 kg (221 lb).  Lab Results: No results found for this or any previous visit (from the past 48 hour(s)).  Treatment Plan Summary:  1.  Daily contact with patient to assess and evaluate symptoms and progress in treatment  2.  Medication management  The patient will deny suicidal ideations or thoughts of self-harm for 48 hours prior to discharge and have a depression and anxiety rating of 3 or less.   Plan:  1. Will continue current medications. 2. Continue to monitor. 3. Possible discharge tomorrow.  SUICIDE RISK:   Mild:   Suicidal ideation of limited frequency, intensity, duration, and specificity.  There are no identifiable plans, no associated intent, mild dysphoria and related symptoms, good self-control (both objective and subjective assessment), few other risk factors, and identifiable protective factors, including available and accessible social support.  PLAN OF CARE:   Cynthia Phelps 08/18/2011, 5:02 PM

## 2011-08-18 NOTE — Progress Notes (Signed)
[facility] Group Notes:  (Counselor/Nursing/MHT/Case Management/Adjunct)  08/18/2011 1:23 PM  Type of Therapy:  Group therapy  Participation Level:  Active  Participation Quality:  Appropriate and Attentive  Affect:  Blunted  Cognitive:  Appropriate  Insight:  Limited  Engagement in Group:  Limited  Engagement in Therapy:  Limited   Modes of Intervention:  Support and Exploration  Summary of Progress/Problems: Cynthia Phelps chose a picture of a baby bird as her representation of wellness, stating that it has the chance to grow up strong and free, which she equates with being well. She did not identify how this correlates with her own experience. Cynthia Phelps was somewhat supportive to other group members who struggled with the loss of innocence as a child. She left group early without really engaging in the therapeutic processing.   Billie Lade 08/18/2011, 1:23 PM
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
## 2011-08-19 MED ORDER — ACETAMINOPHEN 325 MG PO TABS: 650.0000 mg | ORAL_TABLET | Freq: Four times a day (QID) | ORAL | Status: DC | PRN

## 2011-08-19 NOTE — Progress Notes (Signed)
Pt laying in bed resting. Respirations even and unlabored.

## 2011-08-19 NOTE — Progress Notes (Signed)
  [facility] MD Progress Note  08/19/2011 3:59 PM  Diagnosis:   Axis I:  Mood Disorder - NOS Axis II:  Borderline Personality Disorder  The patient was seen today and reports the following:  ADL's: Intact   Sleep: Sleeping well without difficulty. Appetite: Yes, AEB: Good Appetite.   Mild>(1-10) >Severe  Hopelessness (1-10): 0  Depression (1-10): 0  Anxiety (1-10): 0   Suicidal Ideation: Adamantly denies any suicidal ideations.  Plan: No  Intent: No  Means: No  Homicidal Ideation: Adamantly denies any Homicidal Ideations.  Plan: No  Intent: No  Means: No   Mental Status: AO x 3  General Appearance /Behavior: Neat and Casual  Eye Contact: Good  Motor Behavior: Normal  Speech: Normal  Level of Consciousness: Alert  Mood: Euthymic  Affect: Bright and Full Anxiety Good Control. Thought Process: Coherent Thought Content: WNL with no auditory or visual hallucinations or delusional thinking.  Perception: Normal  Judgment: Fair  Insight: Good.  Cognition: Orientation time, place and person   Vital Signs:Blood pressure 136/98, pulse 108, temperature 98.3 F (36.8 C), temperature source Oral, resp. rate 19, height 5\' 1"  (1.549 m), weight 100.245 kg (221 lb).  Lab Results: No results found for this or any previous visit (from the past 48 hour(s)).  Treatment Plan Summary:  1. Daily contact with patient to assess and evaluate symptoms and progress in treatment  2. Medication management  3. The patient will deny suicidal ideations for 48 hours prior to discharge and have a depression and anxiety rating of 3 or less.   Plan:  1. Continue current medications.  2. Case manager to assist with placement options. 3. Continue to monitor.   Cynthia Phelps 08/19/2011, 3:59 PM

## 2011-08-19 NOTE — Progress Notes (Signed)
Patient seen individually and with MD.  She was also present during discharge plannning group.  Patient denies SI/Hi.   She was informed that Arlene at [facility] will not allow her to return to the facility.  Patient became very depressed and tearful but denied SI/HI.  Writer spoke with patient's father who reports being her legal guardian.  He advised of patient having multiple hospitalizations and placement.  Father informed that FL2 will be sent out. He asked that FL2 be sent out to [facility].  Father to fax a copy of guardianship forms. He advised that we will Need to work with any facility willing to work with patient.  He also indicated that he be working with his contact at  [employer] who oversees hospitals and discharges for mentally ill patients.  Father was informed that Clinical research associate will gladly  Accept any assistance he can provide with placement.

## 2011-08-19 NOTE — Progress Notes (Signed)
  Patient pleasant and cooperative this morning, out in the milieu and attending groups.  Received word this afternoon that she would not be allowed to go back to the group home she was at prior to admission.  She was a bit tearful when first hearing this, but cheered up after talking with staff.  She has been out of her room much of the day and working on word puzzles some.  Interacting well with staff and peers.

## 2011-08-19 NOTE — Progress Notes (Signed)
[facility] Group Notes:  (Counselor/Nursing/MHT/Case Management/Adjunct)  08/19/2011 1:50 PM  Type of Therapy:  Group Therapy  Participation Level:  Did Not Attend    Cynthia Phelps 08/19/2011, 1:50 PM

## 2011-08-20 NOTE — Progress Notes (Signed)
Recreation Therapy Group Note  Date: 08/20/2011         Time: 1415      Group Topic/Focus: The focus of this group is on enhancing patients' ability to work cooperatively with others. Groups discusses barriers to cooperation and strategies for successful cooperation.   Participation Level: Did not attend  Participation Quality: Not Applicable  Affect: Not Applicable  Cognitive: Not Applicable   Additional Comments: None   Cynthia Phelps 08/20/2011 3:55 PM 

## 2011-08-20 NOTE — H&P (Signed)
NAMESMT, LOKEY NO.:  [id number]  MEDICAL RECORD NO.:  [id number]  LOCATION:  1415                         FACILITY:  [facility]  PHYSICIAN:  Jerilee Field, MD   DATE OF BIRTH:  1991-09-17  DATE OF ADMISSION:  08/12/2011 DATE OF DISCHARGE:                             HISTORY & PHYSICAL   HISTORY OF PRESENT ILLNESS:  Ms. Massaro is a 20 year old [place] female. 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  She also has a borderline personality disorder.  Last night, she was upset with her father and pushed a marker cap through her urethra into her bladder.  Currently, she has some pain and dysuria with voiding and some mild hematuria, but she is voiding sufficiently and the postvoid residual was 60 mL.  She has done this twice before, placing a pen and a marker in her bladder which required open cystotomy to remove.  In the emergency room, Dr. Radford Pax did an ultrasound of the patient's bladder and saw a hyperechoic linear object consistent with a marker cap in the patient's bladder.  PAST MEDICAL HISTORY: 1. Asthma. 2. Borderline personality. 3. Psychosexual disorder - with self-mutilation. 4. Depression. 5. Hyperlipidemia.  PAST SURGICAL HISTORY:  Open cystotomy x2.  MEDICATIONS:  Clozaril, Zocor, MiraLax, Spiriva.  ALLERGIES:  LATEX, HALDOL, DOXYCYCLINE, TETRACYCLINE.  SOCIAL HISTORY:  The patient smokes.  She lives in [place] group home. She is a former drug abuser and occasional drinker.  FAMILY HISTORY:  Noncontributory.  REVIEW OF SYSTEMS:  10-system review of systems was obtained and is negative except as per HPI.  PHYSICAL EXAMINATION:  VITAL SIGNS:  Temperature is 97.7, pulse of 63, respiration 14, blood pressure 123/72. GENERAL:  She is in no acute distress. NEUROLOGIC:  There is no focal deficits.  She is alert and oriented x3. HEAD:  Normocephalic. CARDIOVASCULAR:  Reveals regular  rate and rhythm. LUNGS:  Regular respiratory rate and depth. ABDOMEN:  Soft, and nontender.  Suprapubic area is nontender.  There is a midline scar and a Pfannenstiel incision scar on her lower abdomen. EXTREMITIES:  No edema.  There is SCDs in place.  DIAGNOSTIC DATA:  Urinalysis showed large blood, negative nitrite, moderate leukocyte.  Microscopic exam showed 0-2 Slutsky cells per high- power field, too-numerous-to-count red cells, and rare bacteria.  IMPRESSION:  Self-mutilation with foreign body placed in the bladder and history of psychosexual disorder and borderline personality disorder.  PLAN:  I discussed with the patient and her father the nature, risks, benefits, and alternatives to cystoscopy with foreign body removal, possible percutaneous cystotomy or possible open cystotomy to remove the foreign body.  We discussed the possible complications during the surgery and the postop course.  We discussed the normal postop course that may require extended Foley catheter.  We discussed risks such asincontinence, urethral stricture, and damage to surrounding structures among other risks.  All questions were answered and the patient and her father wished to proceed.  The patient will also be evaluated by psychiatry and the consult has already been placed.  She has been NPO and on Cipro.  ______________________________ Jerilee Field, MD     ME/MEDQ  D:  08/12/2011  T:  08/12/2011  Job:  [id number]  Electronically Signed by Jerilee Field MD on 08/20/2011 07:19:53 PM

## 2011-08-20 NOTE — Progress Notes (Signed)
  Pt is a 20 yr old that presents bright in affect with no concerns during this shift. Pt attended group with participation. Pt denies any SI/HI at this time. Comfort and avialabilty as needed was extended to the patient. Pateint safety remains with q81min checks.

## 2011-08-20 NOTE — Op Note (Signed)
Cynthia Phelps, SEABERRY NO.:  [id number]  MEDICAL RECORD NO.:  [id number]  LOCATION:  1415                         FACILITY:  [facility]  PHYSICIAN:  Jerilee Field, MD   DATE OF BIRTH:  Sep 30, 1991  DATE OF PROCEDURE:  08/12/2011 DATE OF DISCHARGE:                              OPERATIVE REPORT   PREOPERATIVE DIAGNOSES: 1. Foreign body in bladder. 2. Psychosexual disorder with self-mutilation.  POSTOPERATIVE DIAGNOSES: 1. Foreign body in bladder. 2. Psychosexual disorder with self-mutilation.  PROCEDURES:  Cystoscopy, urethral dilation, and removal of foreign body.  SURGEON:  Jerilee Field, MD  TYPE OF ANESTHESIA:  General.  INDICATION FOR PROCEDURE:  [name] is a 20 year old female with borderline personality disorder and psychosexual disorder.  Recently, she has been unstable from a psychiatric point of view, being transferred from __________ to Renita Papa at [facility], and then to [facility] most recently.  She stopped her Clozaril.  History was taken from the patient and her father.  She was upset with her father yesterday and inserted a 6 cm brown marker cap through her urethra into her bladder.  [facility] transferred her to [facility] ER, and she was brought to the [facility] for surgical intervention with cystoscopy and foreign body removal.  Psych and social re-evaluation are pending.  FINDINGS:  On exam, the patient had normal external genitalia.  The bladder was palpably normal, and the urethra was palpably normal.  The urethra was quite patulous with erythema and edema all along the wall of the urethra consistent with dilation from the marker.  The bladder itself had some old blood clots and irritation and the erythema on the bladder wall from the marker foreign body.  The urethra dilated quite easily to 32-French as it was already dilated from the marker cap.  The cap was removed intact without difficulty.  DESCRIPTION OF PROCEDURE:   After consent was obtained from the patient's father and the patient, she was brought to the operating room.  A time- out was performed to confirm the patient and procedure.  After adequate anesthesia, she was placed in the lithotomy position, prepped and draped in the usual sterile fashion.  A rigid cystoscope was passed per urethra with the previous findings noted.  In the bladder was a large fenestrated brown marker cap.  Several small old clots were flushed out of the bladder, and the bladder was reinspected and noted to have no other abnormality apart from some erythema from irritation from the marker cap.  The cystoscope was removed.  The urethra was dilated to 46- [place] without difficulty.  The urethra was already quite patulous and held the dilators easily.  The cystoscope was then passed back into the bladder with the obturator, and the rigid biopsy forceps were then attached.  The rigid biopsy forceps worked well; with the sharp teeth, we were able to cut into the plastic of the cap and grip it nicely.  Thecap initially was in a left-to-right orientation, I was able to grab the rounded end of the cap and pull it medially and lined it up with the urethra.  It was then released and re-grasped and then able to  be pulled out in line straight to the urethra without difficulty.  Bladder and the urethra were reinspected and noted to be free of injury, and there was no bleeding.  The patient's bladder was drained, and the scope was removed.  She was given a B and O suppository per rectum, and she was then awakened and taken to recovery room in stable condition.  SPECIMENS:  Brown marker cap to Pathology for gross ID only.  ESTIMATED BLOOD LOSS:  Zero.  COMPLICATIONS:  None.  DISPOSITION:  The patient stable to PACU.          ______________________________ Jerilee Field, MD     ME/MEDQ  D:  08/12/2011  T:  08/12/2011  Job:  [id number]  Electronically Signed by Jerilee Field MD on 08/20/2011 07:20:08 PM

## 2011-08-20 NOTE — Progress Notes (Signed)
[facility] Group Notes:  (Counselor/Nursing/MHT/Case Management/Adjunct)  08/20/2011 1:52 PM  Type of Therapy:  Group Therapy  Participation Level:  Minimal  Participation Quality:  Inattentive and Distracting  Affect:  Excited and Irritable  Cognitive:  Appropriate  Insight:  None  Engagement in Group:  Limited  Engagement in Therapy:  None  Modes of Intervention:  Support and Exploration  Summary of Progress/Problems: Cynthia Phelps came in and out of group several times, stating each time that she could not sit still. She was not engaged in group, though at one point she did state that it was very hard for her to accept that her grandfather had died. This was not directly related to the topic group was processing and very shortly after making the statement Cynthia Phelps left group again.    Billie Lade 08/20/2011, 1:52 PM

## 2011-08-20 NOTE — Progress Notes (Signed)
  Pt is awake and active on the unit this AM. Pt denies SI/HI and A/V hallucinations. Pt affect is bright and mood is appropriate. Pt is attending groups and meals today. Pt is expecting a visit from assisted living manager for possible acceptance into their program. Pt is optimistic about her chances of arrainging aftercare with the company. Writer will continue to monitor.

## 2011-08-20 NOTE — Discharge Summary (Signed)
  Cynthia Phelps, ISAACKS NO.:  [id number]  MEDICAL RECORD NO.:  [id number]  LOCATION:  1415                         FACILITY:  [facility]  PHYSICIAN:  Jerilee Field, MD   DATE OF BIRTH:  01-25-1991  DATE OF ADMISSION:  08/12/2011 DATE OF DISCHARGE:                              DISCHARGE SUMMARY   ADMITTING DIAGNOSES: 1. Foreign body in bladder. 2. Borderline personality disorder. 3. Psychosexual disorder with self-mutilation.  DISCHARGE DIAGNOSES: 1. Borderline personality disorder. 2. Psychosexual disorder with self-mutilation.  PROCEDURES DURING HOSPITALIZATION:  Cystoscopy with removal of foreign body.  HOSPITAL COURSE:  [name] was admitted after placing a brown marker cap in her bladder per urethra.  She told me that she did this because she was upset with her father.  She was admitted and underwent cystoscopy with removal of the marker cap without difficulty.  She was evaluated by Psychiatry.  Recently, she had been at [facility]; then the [facility] in [place], admitted inpatient for a brief period last week; and then was at [facility] when she hurt herself by placing the marker cap in her bladder.  Psychiatry recommended that she go to the [facility] or [facility].  Psychiatry started the patient on Geodon.  The patient has been noncompliant with Clozaril.  She has had a stable postop course and is stable medically for discharge.  She was discharged on hospital day #2/postop day#1.  DISCHARGE MEDICATIONS: 1. Geodon 40 mg p.o. b.i.d. 2. Zocor 20 mg daily. 3. Cipro 500 mg p.o. b.i.d. x2 days. 4. Celexa 40 mg p.o. q.a.m. 5. Spiriva 1 cap inhaled q.a.m. 6. Albuterol 2 puffs q.4 hours p.r.n. 7. Aspirin 81 mg p.o. daily. 8. Fish oil.  DISCHARGE INSTRUCTIONS:  The patient does not need routine urologic followup.  She was instructed to follow up if she has bothersome lower urinary tract symptoms or gross  hematuria.  Other medical and psychiatric treatment will be carried out at her next hospital.  Addendum: While waiting transfer the patient bent off a fork prong and put it in her bladder. This was removed Aug 16, 2011 in the OR via cystoscopy. She was seen by Dr. Allena Katz who accepted the patient in transfer to [facility] on Sunday  Aug 17, 2011.          ______________________________ Jerilee Field, MD     ME/MEDQ  D:  08/13/2011  T:  08/14/2011  Job:  [id number]  Electronically Signed by Jerilee Field MD on 08/20/2011 07:22:40 PM

## 2011-08-20 NOTE — Progress Notes (Signed)
[facility] MD Progress Note  08/20/2011 2:01 PM  Diagnosis:  Axis I: Mood Disorder - NOS  Axis II: Borderline Personality Disorder   The patient was seen today and reports the following:   ADL's: Intact  Sleep: Sleeping well without difficulty.  Appetite: Yes, AEB: Good Appetite.   Mild>(1-10) >Severe  Hopelessness (1-10): 0  Depression (1-10): 0  Anxiety (1-10): 0   Suicidal Ideation: Adamantly denies any suicidal ideations.  Plan: No  Intent: No  Means: No  Homicidal Ideation: Adamantly denies any Homicidal Ideations.  Plan: No  Intent: No  Means: No  Mental Status: AO x 3   General Appearance /Behavior: Neat and Casual  Eye Contact: Good  Motor Behavior: Normal  Speech: Normal  Level of Consciousness: Alert  Mood: Euthymic  Affect: Bright and Full  Anxiety Good Control.  Thought Process: Coherent  Thought Content: WNL with no auditory or visual hallucinations or delusional thinking.  Perception: Normal  Judgment: Fair  Insight: Good.  Cognition: Orientation time, place and person   Vital Signs:Blood pressure 136/82, pulse 92, temperature 98.5 F (36.9 C), temperature source Oral, resp. rate 20, height 5\' 1"  (1.549 m), weight 100.245 kg (221 lb).  Lab Results: No results found for this or any previous visit (from the past 48 hour(s)).  Time was spent discussing with the patient our plans for placement.  Ms. Livengood states that she wants to put her self injurious behavior behind her and go back to school and make a good life for self.   Treatment Plan Summary:  1. Daily contact with patient to assess and evaluate symptoms and progress in treatment  2. Medication management  3. The patient will deny suicidal ideations for 48 hours prior to discharge and have a depression and anxiety rating of 3 or less.   Plan:  1. Continue current medications.  2. Case manager to assist with placement options.  3. Continue to monitor.  Ladean Steinmeyer 08/20/2011, 2:01 PM

## 2011-08-20 NOTE — Progress Notes (Signed)
Patient seen during d/c planning group.  She reports doing well and hopes to have a placement soon.  She denies SI/HI.  FL2 sent out to facilities in [facility]  Copy of guardianship paperwork filed in chart.

## 2011-08-20 NOTE — Op Note (Signed)
  NAMEKEIKO, Cynthia Phelps NO.:  [id number]  MEDICAL RECORD NO.:  [id number]  LOCATION:  1415                         FACILITY:  [facility]  PHYSICIAN:  Jerilee Field, MD   DATE OF BIRTH:  07-Feb-1991  DATE OF PROCEDURE: DATE OF DISCHARGE:                              OPERATIVE REPORT   PREOPERATIVE DIAGNOSES: 1. Borderline personality disorder. 2. Self-mutilation tendencies. 3. Foreign body in bladder.  POSTOPERATIVE DIAGNOSES: 1. Borderline personality disorder. 2. Self-mutilation tendencies. 3. Foreign body in bladder.  PROCEDURE: 1. Cystoscopy. 2. Removal of foreign body.  SURGEON:  Jerilee Field, MD  INDICATION FOR PROCEDURE:  Ms. Thurmond is a 20 year old with psychiatric history and wants to harm herself.  Despite having a sitter, she bent 1 prong from a metallic fork and was able to break it off and put it per urethra into her bladder.  I was present when the nurses called her father.  Discussed the nature, risks, benefits, and alternatives of the cystoscopy with foreign body removal.  All questions were answered and he elected to proceed.  FINDINGS:  Urethra was normal, just slightly dilated from her marker cap.  She has some mild erythema and edema of the urethra.  The bladder inflammation was much improved that there was a metal fork prong sitting in her bladder.  This was photographed, grasped with graspers and removed.  Bladder was inspected and had noted to have no other foreign bodies, stones or injuries.  She was then awakened, taken recovery room in stable condition.  COMPLICATIONS:  None.  BLOOD LOSS:  None.  SPECIMENS:  Metal fork prong to pathology for gross ID.  DISPOSITION:  The patient stable to PACU.          ______________________________ Jerilee Field, MD     ME/MEDQ  D:  08/16/2011  T:  08/16/2011  Job:  [id number]  Electronically Signed by Jerilee Field MD on 08/20/2011 07:22:51 PM

## 2011-08-21 ENCOUNTER — Encounter (HOSPITAL_COMMUNITY): Payer: Self-pay | Admitting: Psychiatry

## 2011-08-21 MED ORDER — TRAZODONE HCL 50 MG PO TABS
25.0000 mg | ORAL_TABLET | Freq: Every day | ORAL | Status: DC
  Administered 2011-08-21: 25 mg via ORAL
  Filled 2011-08-21 (×2): qty 4
  Filled 2011-08-21: qty 0.5
  Filled 2011-08-21: qty 1

## 2011-08-21 NOTE — Progress Notes (Signed)
Pt. Sleep most of the evening up upon assessment asking for snack. Pt. Denies pain, SHI. Pt. Hopes to leave tomorrow for ALF. Staff will continue to monitor q61min for safety.

## 2011-08-21 NOTE — Progress Notes (Signed)
  Pt awake and active in the miileu this AM. Pt affect is bright and mood is appropriate. Pt is attending meals and groups today. Pt denies SI/HI and A/V hallucinations. Pt utilized the unit bathtub this AM and is in good sprits. Pt reports a good meeting with aftercare liaison and is optimistic about being admitted there. Pt b/p elevated. Writer rechecked and it is wnl. Writer will continue to monitor.

## 2011-08-21 NOTE — Progress Notes (Signed)
Interdisciplinary Treatment Plan Update (Adult)  Date:  08/21/2011 Time Reviewed:  10:02 AM  Progress in Treatment: Attending groups: Yes. Participating in groups:  Yes. Taking medication as prescribed:  Yes. Tolerating medication:  Yes. Family/Significant othe contact made:  Yes, individual(s) contacted:  Contact made with Father who is patient's legal guardian Patient understands diagnosis:  Yes. Discussing patient identified problems/goals with staff:  Yes. Medical problems stabilized or resolved:  Yes. Denies suicidal/homicidal ideation: Yes. Issues/concerns per patient self-inventory:  No. Other:  New problem(s) identified: No, Describe:  None identified  Reason for Continuation of Hospitalization: Other; describe Placement  Interventions implemented related to continuation of hospitalization:  Additional comments:  Estimated length of stay: 1-2 days  Discharge Plan:  Patient to discharge to a group hom  New goal(s):  Review of initial/current patient goals per problem list:   1.  Goal(s):  Assist patient with group home placement  Met:  No  Target date: d/c  As evidenced by:  2.  Goal (s):  Refer for outpatient follow up  Met:  Yes  Target date:d/c  As evidenced by:  Patient will f/u with [facility] upon discharge  3.  Goal(s): Review suicide prevention education  Met:  Yes  Target date: d/c As evidenced by  Reviewed during discharge planning group on 08/20/11.  : 4.  Goal(s):  Met:  No  Target date: As evidenced by:Rev  Attendees: Patient:  Cynthia Phelps 11/8/201210:02 AM  Family:   11/8/201210:02 AM  Physician:  Franchot Gallo, Md 11/8/201210:02 AM  Nursing:   Neill Loft, RN 11/8/201210:02 AM  Case Manager:  Wynn Banker, LCSW  11/8/201210:02 AM  Counselor:  Angus Palms 11/8/201210:02 AM  Other:   11/8/201210:02 AM  Other:   11/8/201210:02 AM  Other:   11/8/201210:02 AM  Other:   11/8/201210:02 AM   Scribe for  Treatment Team:   Wynn Banker, 08/21/2011, 10:02 AM

## 2011-08-21 NOTE — Progress Notes (Signed)
This encounter was created in error - please disregard.

## 2011-08-21 NOTE — Progress Notes (Signed)
[facility] MD Progress Note  08/21/2011 3:06 PM  Diagnosis:  Axis I: Mood Disorder - NOS  Axis II: Borderline Personality Disorder   The patient was seen today and reports the following:   ADL's: Intact  Sleep: Some difficulty maintaining sleep reported. Appetite: Yes, AEB: Good Appetite.   Mild>(1-10) >Severe  Hopelessness (1-10): 0  Depression (1-10): 0  Anxiety (1-10): 0   Suicidal Ideation: Adamantly denies any suicidal ideations.  Plan: No  Intent: No  Means: No  Homicidal Ideation: Adamantly denies any Homicidal Ideations.  Plan: No  Intent: No  Means: No  Mental Status: AO x 3   General Appearance /Behavior: Neat and Casual  Eye Contact: Good  Motor Behavior: Normal  Speech: Normal  Level of Consciousness: Alert  Mood: Euthymic  Affect: Bright and Full  Anxiety Good Control.  Thought Process: Coherent  Thought Content: WNL with no auditory or visual hallucinations or delusional thinking.  Perception: Normal  Judgment: Fair  Insight: Good.  Cognition: Orientation time, place and person  Sleep:  Number of Hours: 4.5   Vital Signs:Blood pressure 128/78, pulse 83, temperature 100.1 F (37.8 C), temperature source Oral, resp. rate 16, height 5\' 1"  (1.549 m), weight 100.245 kg (221 lb).  Lab Results: No results found for this or any previous visit (from the past 48 hour(s)).  The patient was visited last evening by the director of a local group home which appeared to meet the patient's needs.  The visit went well, but it was learned that the patient's Father called the group home after the visit and reported information to the Director which allegedly resulted in her changing her mind.  The case manager will continue her efforts to find appropriate placement.  Treatment Plan Summary:  1. Daily contact with patient to assess and evaluate symptoms and progress in treatment  2. Medication management  3. The patient will deny suicidal ideations for 48 hours prior to  discharge and have a depression and anxiety rating of 3 or less.   Plan:  1. Continue current medications. 2. Will add Trazodone 25 mgs po qhs to help maintain sleep. 3. Case manager continue to assist with placement options.  4. Continue to monitor.   Cynthia Phelps 08/21/2011, 3:06 PM

## 2011-08-21 NOTE — Progress Notes (Signed)
[facility] Group Notes:  (Counselor/Nursing/MHT/Case Management/Adjunct)  08/21/2011 2:14 PM  Type of Therapy:  Psychoeducational Skills  Participation Level:  Minimal  Participation Quality:  Redirectable  Affect:  Anxious  Cognitive:  Oriented  Insight:  Limited  Engagement in Group:  Limited  Engagement in Therapy:  Limited  Modes of Intervention:  Education  Summary of Progress/Problems: Cynthia Phelps required redirection quite a few times during the session. Some of her responses were not related to the topic and she at times was distracting.  Cynthia Phelps did however demonstrate her understanding of wise mind and the interrelated components (reason/emotion) but did not share how the concept relates to her personal life. Cynthia Phelps left the group session early contributing it to her inability to sit still for a long period of time.  [name] 08/21/2011, 2:14 PM

## 2011-08-21 NOTE — Progress Notes (Signed)
  Pt is up and active in the milieu this AM. Pt affect is bright and mood is appropriate for situation. Pt is attending meals and groups today. Pt denies SI/HI and A/V hallucinations. Pt was hopping to be admitted into a new group home today, but they decided not to accept her. Pt has been accepting the bad news well and has another interview this evening. Writer will continue to monitor.

## 2011-08-22 ENCOUNTER — Ambulatory Visit (HOSPITAL_COMMUNITY)
Admit: 2011-08-22 | Discharge: 2011-08-22 | Disposition: A | Discharge status: Home / Self Care | Payer: Medicaid Other | Attending: Psychiatry | Admitting: Psychiatry

## 2011-08-22 MED ORDER — ZIPRASIDONE HCL 40 MG PO CAPS: 40.0000 mg | ORAL_CAPSULE | Freq: Two times a day (BID) | ORAL | Status: DC

## 2011-08-22 MED ORDER — TRAZODONE HCL 50 MG PO TABS: 25.0000 mg | ORAL_TABLET | Freq: Every day | ORAL | Status: DC

## 2011-08-22 MED ORDER — CITALOPRAM HYDROBROMIDE 20 MG PO TABS: 20.0000 mg | ORAL_TABLET | Freq: Every day | ORAL | Status: AC

## 2011-08-22 NOTE — Progress Notes (Signed)
[facility] Group Notes:  (Counselor/Nursing/MHT/Case Management/Adjunct)  08/22/2011 3:10 PM  Type of Therapy:  Group Therapy  Participation Level:  Did Not Attend  Billie Lade 08/22/2011, 3:10 PM

## 2011-08-22 NOTE — Progress Notes (Signed)
Patient seen during d/c planning group and was excited to have a placement with [facility].  Writer spoke with father to advise of placement.  Father, who is patient's guardian was not please but agreed to placement.  He requested a copy of patients FL2  Be sent to him in event placement does not work out.  Writer was informed by Garen Lah at [facility] on 11/8/2 that she was considering taking patient until speaking with father about patient's history.  Writer also spoke with patient's target case manager at [facility] who was concern that we had not given patient's history to the assisted living.  She was informed that giving patient's history to the AL would be the equivalent of providing hear say as we work with the here and now and patient has been ideal and did not meet criteria for inpatient or state transfer.

## 2011-08-22 NOTE — Progress Notes (Signed)
Suicide Risk Assessment  Discharge Assessment     Demographic factors:  Assessment Details Information Obtained From: Patient  Diagnosis:  Axis I: Mood Disorder - NOS  Axis II: Borderline Personality Disorder   The patient was seen today and reports the following:   Current Mental Status:  ADL's: Intact  Sleep: Slept well last night. Appetite: Yes, AEB: Good Appetite.   Mild>(1-10) >Severe  Hopelessness (1-10): 0  Depression (1-10): 0  Anxiety (1-10): 0   Suicidal Ideation: Adamantly denies any suicidal ideations.  Plan: No  Intent: No  Means: No  Homicidal Ideation: Adamantly denies any Homicidal Ideations.  Plan: No  Intent: No  Means: No  Mental Status: AO x 3  General Appearance /Behavior: Neat and Casual  Eye Contact: Good Motor Behavior: Normal  Speech: Normal  Level of Consciousness: Alert  Mood: Euthymic  Affect: Bright and Full  Anxiety Good Control.  Thought Process: Coherent  Thought Content: WNL with no auditory or visual hallucinations or delusional thinking.  Perception: Normal  Judgment: Good  Insight: Good.  Cognition: Orientation time, place and person  Sleep: Number of Hours: 4.5   Treatment Plan Summary:  1. Daily contact with patient to assess and evaluate symptoms and progress in treatment  2. Medication management  3. The patient will deny suicidal ideations for 48 hours prior to discharge and have a depression and anxiety rating of 3 or less.   Plan:  1. Continue current medications.  2. Continue to monitor. 3. Discharge today.  CLINICAL FACTORS:   Personality Disorders:   Cluster B  SUICIDE RISK:   Minimal: No identifiable suicidal ideation.  Patients presenting with no risk factors but with morbid ruminations; may be classified as minimal risk based on the severity of the depressive symptoms  Randy Readling 08/22/2011, 8:24 AM

## 2011-08-22 NOTE — Progress Notes (Signed)
  Pt. Preparing to go to [facility]. Pt. Reports that she maybe leaving tomorrow. Denies SHI, No AVH. Pt. Is cooperative. Staff will continue to monitor q35min for safety.

## 2011-08-22 NOTE — Progress Notes (Signed)
  08-22-11 @ 1343 pt denied any si/hi/av. She got her d/c instructions and stated she understood them. She signed for her belongings, got sample meds and scripts. Pt was escorted to the lobby and jackie with the group home will transport her...sbw,rn

## 2011-08-22 NOTE — Progress Notes (Signed)
  Pt is ready for d/c today. Pt has had no complaints.Marland Kitchen

## 2011-08-22 NOTE — Progress Notes (Signed)
Interdisciplinary Treatment Plan Update (Adult)  Date:  08/22/2011 Time Reviewed:  10:05 AM  Progress in Treatment: Attending groups: Yes. Participating in groups:  Yes. Taking medication as prescribed:  Yes. Tolerating medication:  Yes. Family/Significant othe contact made:  Yes, individual(s) contacted:  Contact made with patient's father Patient understands diagnosis:  Yes. Discussing patient identified problems/goals with staff:  Yes. Medical problems stabilized or resolved:  Yes. Denies suicidal/homicidal ideation: Yes. Issues/concerns per patient self-inventory:  As evidenced by:  None identified Other:  New problem(s) identified: Yes, Describe:  Father is patient's legal guardian. He has been resistance to placement as he is interested in patient being sent to the state.  Father was advised that patient does not meet critera for state referral.  Writer shared that patient does not meet critera for inpatient stay.   Patient was declined for placement in another facility as a result of father's report to group home owner.   Father is not willing to take patient home.  He is requesting a copy of patient's [place] 2 in event placement with [facility] not acceptable.  He relunctantly agrees to the placement.  Father was encouranged to visit facility today prior to discharging today.  Father advised that he has other responsibilities.  Reason for Continuation of Hospitalization: Interventions implemented related to continuation of hospitalization:  Additional comments:  Estimated length of stay:  Discharge today  Discharge Plan:  [name].  She will continue to have targeted case management through [facility].    New goal(s):  Review of initial/current patient goals per problem list:   1.  Goal(s):  Eliminate SI  Met:  Yes  Target date:d/c  As evidenced by:  No report of SI  2.  Goal (s):  Eliminate  depression  Met:  Yes  Target date: d/c  As evidenced by:  Denies depression  3.  Goal(s):  Assist with placement  Met:  Yes  Target date: d/c  As evidenced by:  Placement at [facility]  4.  Goal(s):  Met:  No  Target date:  As evidenced by:  Attendees: Patient:  Cynthia Phelps 11/9/201210:05 AM  Family:   11/9/201210:05 AM  Physician:  Franchot Gallo, Md 11/9/201210:05 AM  Nursing:   Manuela Schwartz, RN 11/9/201210:05 AM  Case Manager:  Juline Patch, LCSW 11/9/201210:05 AM  Counselor:  Angus Palms 11/9/201210:05 AM  Other:  Landry Corporal, NP 11/9/201210:05 AM  Other:  Michele Rockers, RN, DON 11/9/201210:05 AM  Other:   11/9/201210:05 AM  Other:   11/9/201210:05 AM   Scribe for Treatment Team:   Wynn Banker, 08/22/2011, 10:05 AM

## 2011-08-22 NOTE — Plan of Care (Signed)
Problem: Alteration in mood Goal: STG-Other (Specify): Outcome: Adequate for Discharge Brownie has explored appropriate boundaries with her family. She reports that she has coping skills to deal with family conflict - journaling and talking to her support system - and that she was not using them prior to admission because her medication was not right. Jacia does not seem willing or able to explore any further barriers to using her coping skills, but does have a plan to use them now.

## 2011-08-23 NOTE — Discharge Summary (Addendum)
Physician Discharge Summary  Patient ID: Cynthia Phelps MRN: [id number] DOB/AGE: [birth year] 20 y.o.  Admit date: 08/17/2011 Discharge date: 08/23/2011  Admission Diagnoses:  Discharge Diagnoses:  Principal Problem:  *Self mutilating behavior   Discharged Condition: fully alert cooperative. Adamantly denied suicidal or homicidal thinking. No psychosis. No medication side effects  Hospital Course: patient was active on unit, we resumed her prior medications. We continued her antibiotics for UTI. She was always pleasant on approach, compliant with medication. Case manager was attempting to clarify placement   Discharge Exam: Blood pressure 146/87, pulse 75, temperature 98.8 F (37.1 C), temperature source Oral, resp. rate 18, height 5\' 1"  (1.549 m), weight 100.245 kg (221 lb).   Disposition: ED Dismiss - Diverted Elsewhere  Discharge Orders    Future Orders Please Complete By Expires   Diet - low sodium heart healthy        Discharge Medication List as of 08/22/2011 10:59 AM    START taking these medications   Details  traZODone (DESYREL) 50 MG tablet Take 0.5 tablets (25 mg total) by mouth at bedtime., Starting 08/22/2011, Until Sun 09/21/11, Print      CONTINUE these medications which have CHANGED   Details  citalopram (CELEXA) 20 MG tablet Take 1 tablet (20 mg total) by mouth daily., Starting 08/22/2011, Until Discontinued, Print    ziprasidone (GEODON) 40 MG capsule Take 1 capsule (40 mg total) by mouth 2 (two) times daily with a meal., Starting 08/22/2011, Until Thu 11/20/11, Print      CONTINUE these medications which have NOT CHANGED   Details  aspirin EC 81 MG tablet Take 81 mg by mouth daily.  , Until Discontinued, Historical Med    docusate sodium (COLACE) 100 MG capsule Take 100 mg by mouth 2 (two) times daily.  , Until Discontinued, Historical Med    LORazepam (ATIVAN) 2 MG tablet Take 2 mg by mouth at bedtime.  , Until Discontinued, Historical Med    tiotropium  (SPIRIVA) 18 MCG inhalation capsule Place 18 mcg into inhaler and inhale daily.  , Until Discontinued, Historical Med    albuterol (PROVENTIL HFA;VENTOLIN HFA) 108 (90 BASE) MCG/ACT inhaler Inhale 2 puffs into the lungs every 4 (four) hours as needed. For shortness of breath , Until Discontinued, Historical Med    fish oil-omega-3 fatty acids 1000 MG capsule Take 1 g by mouth daily.  , Until Discontinued, Historical Med    simvastatin (ZOCOR) 20 MG tablet Take 20 mg by mouth daily.  , Until Discontinued, Historical Med       Follow-up Information    Follow up with [facility] on 08/26/2011. (Tuesday, 11/13 - Some one will pick up between 9:15 and 9:30 for your appointment or  [facility] can transport you to the office on Monday)    Contact information:   [address] [place], [place]  [address] [phone]         Signed: Mechele Dawley 08/23/2011, 1:42 PM

## 2011-08-23 NOTE — Discharge Summary (Signed)
Discharge Note  Date of Admission:  08/17/2011  Date of Discharge:  @DISCDT @  Axis Diagnosis:  Mood D/O  Level of Care:    Discharge destination:  Other:  group home  Is patient on multiple antipsychotic therapies at discharge:  No      Patient phone:  [phone] (home)  Patient address:   [address] [place] [address],   Follow-up recommendations:  Other:  follow up with outpatient providers    The patient received suicide prevention pamphlet:  Yes Belongings returned:  Clothing  Mechele Dawley 08/23/2011, 1:46 PM

## 2011-08-23 NOTE — Discharge Summary (Signed)
  Admission Date 08/17/2011  Discharge Date 08/22/2011  Hospital course: Patient was admitted for self mutilating behavior , had inserted an object into urethra out of frustration and anger.   Diagnosis:  Axis I: Mood Disorder - NOS   Axis II: Borderline Personality Disorder   On the day of discharge  Current Mental Status:   ADL's: Intact   Sleep: Slept well last night. Appetite: Yes, AEB: Good Appetite.    Mild>(1-10) >Severe   Hopelessness (1-10): 0   Depression (1-10): 0   Anxiety (1-10): 0    Suicidal Ideation: Adamantly denies any suicidal ideations.  Plan: No   Intent: No   Means: No  Homicidal Ideation: Adamantly denies any Homicidal Ideations.  Plan: No   Intent: No   Means: No   Mental Status: AO x 3   General Appearance /Behavior: Neat and Casual  Eye Contact: Good Motor Behavior: Normal   Speech: Normal   Level of Consciousness: Alert   Mood: Euthymic   Affect: Bright and Full   Anxiety Good Control.   Thought Process: Coherent   Thought Content: WNL with no auditory or visual hallucinations or delusional thinking.   Perception: Normal   Judgment: Good   Insight: Good.   Cognition: Orientation time, place and person   Sleep: Number of Hours: 4.5    Follow up visit: [facility] Tues November 13th  CLINICAL FACTORS:    Personality Disorders:   Cluster B

## 2011-08-25 ENCOUNTER — Emergency Department (HOSPITAL_COMMUNITY)
Admission: EM | Admit: 2011-08-25 | Discharge: 2011-08-25 | Disposition: A | Discharge status: Home / Self Care | Payer: Medicaid Other | Source: Home / Self Care | Attending: Emergency Medicine | Admitting: Emergency Medicine

## 2011-08-25 ENCOUNTER — Encounter (HOSPITAL_COMMUNITY): Payer: Self-pay

## 2011-08-25 ENCOUNTER — Emergency Department (HOSPITAL_COMMUNITY): Payer: Medicaid Other

## 2011-08-25 DIAGNOSIS — F603 Borderline personality disorder: Secondary | ICD-10-CM | POA: Insufficient documentation

## 2011-08-25 DIAGNOSIS — F17200 Nicotine dependence, unspecified, uncomplicated: Secondary | ICD-10-CM | POA: Insufficient documentation

## 2011-08-25 DIAGNOSIS — J45909 Unspecified asthma, uncomplicated: Secondary | ICD-10-CM | POA: Insufficient documentation

## 2011-08-25 DIAGNOSIS — E785 Hyperlipidemia, unspecified: Secondary | ICD-10-CM | POA: Insufficient documentation

## 2011-08-25 DIAGNOSIS — T190XXA Foreign body in urethra, initial encounter: Secondary | ICD-10-CM | POA: Insufficient documentation

## 2011-08-25 DIAGNOSIS — F329 Major depressive disorder, single episode, unspecified: Secondary | ICD-10-CM | POA: Insufficient documentation

## 2011-08-25 DIAGNOSIS — F3289 Other specified depressive episodes: Secondary | ICD-10-CM | POA: Insufficient documentation

## 2011-08-25 DIAGNOSIS — T191XXA Foreign body in bladder, initial encounter: Secondary | ICD-10-CM | POA: Insufficient documentation

## 2011-08-25 DIAGNOSIS — IMO0002 Reserved for concepts with insufficient information to code with codable children: Secondary | ICD-10-CM | POA: Insufficient documentation

## 2011-08-25 MED ORDER — CEPHALEXIN 500 MG PO CAPS: 500.0000 mg | ORAL_CAPSULE | Freq: Four times a day (QID) | ORAL | Status: DC

## 2011-08-25 NOTE — ED Notes (Signed)
Pt here for ?object in vagina/bladder. Pt states " i think it's in my bladder. I had a pen and I was bored and pushed it in my bladder" pt questioned if vagina and pt states, "no, my bladder." pt states this happened an hour and half ago.

## 2011-08-25 NOTE — Progress Notes (Signed)
Patient Discharge Instructions:  Dictated admission note faxed, Date faxed:  08/25/2011 D/C instructions faxed, Date faxed:  08/25/2011 D/C Summary faxed, Date faxed:  08/25/2011 Med. Rec. Form faxed, Date faxed:  08/25/2011  Wandra Scot, 08/25/2011, 1:09 PM

## 2011-08-25 NOTE — ED Provider Notes (Signed)
History   This chart was scribed for Benny Lennert, MD by Clarita Crane. The patient was seen in room APA12/APA12 and the patient's care was started at 9:36PM.   CSN: [id number] Arrival date & time: 08/25/2011  8:57 PM   First MD Initiated Contact with Patient 08/25/11 2134      Chief Complaint  Patient presents with  . Foreign Body in Vagina    (Consider location/radiation/quality/duration/timing/severity/associated sxs/prior treatment) HPI Cynthia Phelps is a 20 y.o. female who presents to the Emergency Department complaining of a foreign body (ink pen) which became lodged within her bladder 3 hours ago after she inserted a full sized ink pen into her urethra. Patient states inserted the pen into her urethra for sexual stimulation. Also notes she has been having difficulty urinating since the ink pen became lodged. Patient reports having a similar occurrence 2 weeks ago in which a pen cap/lid became lodged within her bladder after insertion into her urethra and was removed surgically at [facility].   Past Medical History  Diagnosis Date  . Asthma   . Hyperlipidemia, acquired   . Borderline personality disorder   . Depression     Past Surgical History  Procedure Date  . Open cystotomy   . No past surgeries     History reviewed. No pertinent family history.  History  Substance Use Topics  . Smoking status: Smoker, Current Status Unknown -- 2.0 packs/day    Types: Cigarettes  . Smokeless tobacco: Current User   Comment: unknown at this time  . Alcohol Use: No    OB History    Grav Para Term Preterm Abortions TAB SAB Ect Mult Living                  Review of Systems  Constitutional: Negative for fatigue.  HENT: Negative for congestion, sinus pressure and ear discharge.   Eyes: Negative for discharge.  Respiratory: Negative for cough.   Cardiovascular: Negative for chest pain.  Gastrointestinal: Negative for abdominal pain and diarrhea.  Genitourinary:  Positive for difficulty urinating. Negative for frequency and hematuria.       Foreign body in bladder through urethra.   Musculoskeletal: Negative for back pain.  Skin: Negative for rash.  Neurological: Negative for seizures and headaches.  Hematological: Negative.   Psychiatric/Behavioral: Negative for hallucinations.    Allergies  Haldol; Doxycycline; and Tetracyclines & related  Home Medications   Current Outpatient Rx  Name Route Sig Dispense Refill  . CITALOPRAM HYDROBROMIDE 20 MG PO TABS Oral Take 1 tablet (20 mg total) by mouth daily. 30 tablet 0  . TIOTROPIUM BROMIDE MONOHYDRATE 18 MCG IN CAPS Inhalation Place 18 mcg into inhaler and inhale daily.      . TRAZODONE HCL 50 MG PO TABS Oral Take 0.5 tablets (25 mg total) by mouth at bedtime. 15 tablet 0  . ZIPRASIDONE HCL 40 MG PO CAPS Oral Take 1 capsule (40 mg total) by mouth 2 (two) times daily with a meal. 60 capsule 0  . CEPHALEXIN 500 MG PO CAPS Oral Take 1 capsule (500 mg total) by mouth 4 (four) times daily. 28 capsule 0    BP 125/58  Pulse 81  Temp(Src) 98.7 F (37.1 C) (Oral)  Resp 16  Ht 4\' 11"  (1.499 m)  Wt 220 lb (99.791 kg)  BMI 44.43 kg/m2  SpO2 99%  Physical Exam  Nursing note and vitals reviewed. Constitutional: She is oriented to person, place, and time. She appears well-developed and  well-nourished. No distress.  HENT:  Head: Normocephalic and atraumatic.  Eyes: EOM are normal. Pupils are equal, round, and reactive to light.  Neck: Neck supple. No tracheal deviation present.  Cardiovascular: Normal rate and regular rhythm.   No murmur heard. Pulmonary/Chest: Effort normal and breath sounds normal. No respiratory distress. She has no wheezes.  Abdominal: Soft. She exhibits no distension.  Musculoskeletal: Normal range of motion. She exhibits no edema.  Neurological: She is alert and oriented to person, place, and time. No sensory deficit.  Skin: Skin is warm and dry.  Psychiatric: She has a normal  mood and affect. Her behavior is normal.    ED Course  Procedures (including critical care time)  DIAGNOSTIC STUDIES: Oxygen Saturation is 99% on room air, normal by my interpretation.    COORDINATION OF CARE: 9:54PM- Consult complete with Dr. Jerre Simon. Patient case explained and discussed. Dr. Jerre Simon recommends prescribing abx and d/c home as problem will be surgically corrected in  he coming days.     Labs Reviewed  URINE CULTURE   Dg Abd 1 View  08/25/2011  *RADIOLOGY REPORT*  Clinical Data: The patient reports placing an ink pen into her urinary tract.  ABDOMEN - 1 VIEW  Comparison: None.  Findings: Linear radiodense and radiolucent foreign body overlying the inferior pelvis, centered in the midline.  Normal bowel gas pattern.  Unremarkable bones.  IMPRESSION: Ink pen in the urinary bladder.  Original Report Authenticated By: Darrol Angel, M.D.     1. Foreign body in bladder    I spoke with Dr. Paulino Door and he will see the pt in the pm tomorrow   MDM       Benny Lennert, MD 08/25/11 2214

## 2011-08-26 ENCOUNTER — Inpatient Hospital Stay (HOSPITAL_COMMUNITY)
Admission: EM | Admit: 2011-08-26 | Discharge: 2011-09-01 | DRG: 700 | Disposition: A | Discharge status: Nursing Home (Includes ICF) | Payer: Medicaid Other | Attending: Internal Medicine | Admitting: Internal Medicine

## 2011-08-26 ENCOUNTER — Encounter (HOSPITAL_COMMUNITY): Payer: Self-pay | Admitting: Emergency Medicine

## 2011-08-26 DIAGNOSIS — T190XXA Foreign body in urethra, initial encounter: Principal | ICD-10-CM | POA: Diagnosis present

## 2011-08-26 DIAGNOSIS — T191XXA Foreign body in bladder, initial encounter: Principal | ICD-10-CM | POA: Diagnosis present

## 2011-08-26 DIAGNOSIS — F329 Major depressive disorder, single episode, unspecified: Secondary | ICD-10-CM | POA: Diagnosis present

## 2011-08-26 DIAGNOSIS — F3289 Other specified depressive episodes: Secondary | ICD-10-CM | POA: Diagnosis present

## 2011-08-26 DIAGNOSIS — R319 Hematuria, unspecified: Secondary | ICD-10-CM | POA: Diagnosis present

## 2011-08-26 DIAGNOSIS — IMO0002 Reserved for concepts with insufficient information to code with codable children: Secondary | ICD-10-CM | POA: Diagnosis present

## 2011-08-26 DIAGNOSIS — F603 Borderline personality disorder: Secondary | ICD-10-CM | POA: Diagnosis present

## 2011-08-26 DIAGNOSIS — F17200 Nicotine dependence, unspecified, uncomplicated: Secondary | ICD-10-CM | POA: Diagnosis present

## 2011-08-26 DIAGNOSIS — R300 Dysuria: Secondary | ICD-10-CM | POA: Diagnosis present

## 2011-08-26 DIAGNOSIS — J45909 Unspecified asthma, uncomplicated: Secondary | ICD-10-CM | POA: Diagnosis present

## 2011-08-26 DIAGNOSIS — F411 Generalized anxiety disorder: Secondary | ICD-10-CM | POA: Diagnosis present

## 2011-08-26 DIAGNOSIS — F489 Nonpsychotic mental disorder, unspecified: Secondary | ICD-10-CM | POA: Diagnosis present

## 2011-08-26 DIAGNOSIS — T199XXA Foreign body in genitourinary tract, part unspecified, initial encounter: Secondary | ICD-10-CM | POA: Diagnosis present

## 2011-08-26 DIAGNOSIS — R109 Unspecified abdominal pain: Secondary | ICD-10-CM | POA: Diagnosis present

## 2011-08-26 DIAGNOSIS — E785 Hyperlipidemia, unspecified: Secondary | ICD-10-CM | POA: Diagnosis present

## 2011-08-26 HISTORY — DX: Anxiety disorder, unspecified: F41.9

## 2011-08-26 LAB — URINALYSIS, ROUTINE W REFLEX MICROSCOPIC
Ketones, ur: 40 mg/dL — AB
Nitrite: NEGATIVE
Protein, ur: 100 mg/dL — AB
Urobilinogen, UA: 0.2 mg/dL (ref 0.0–1.0)

## 2011-08-26 LAB — CBC
HCT: 41.4 % (ref 36.0–46.0)
Hemoglobin: 14 g/dL (ref 12.0–15.0)
RDW: 12.9 % (ref 11.5–15.5)
WBC: 11.3 10*3/uL — ABNORMAL HIGH (ref 4.0–10.5)

## 2011-08-26 LAB — COMPREHENSIVE METABOLIC PANEL
ALT: 30 U/L (ref 0–35)
AST: 18 U/L (ref 0–37)
Albumin: 3.5 g/dL (ref 3.5–5.2)
Alkaline Phosphatase: 104 U/L (ref 39–117)
BUN: 13 mg/dL (ref 6–23)
Chloride: 104 mEq/L (ref 96–112)
Potassium: 4.6 mEq/L (ref 3.5–5.1)
Total Bilirubin: 0.1 mg/dL — ABNORMAL LOW (ref 0.3–1.2)

## 2011-08-26 LAB — RAPID URINE DRUG SCREEN, HOSP PERFORMED
Amphetamines: NOT DETECTED
Barbiturates: NOT DETECTED
Tetrahydrocannabinol: NOT DETECTED

## 2011-08-26 LAB — ETHANOL: Alcohol, Ethyl (B): <11 mg/dL (ref 0–11)

## 2011-08-26 LAB — POCT PREGNANCY, URINE: Preg Test, Ur: NEGATIVE

## 2011-08-26 MED ORDER — OXYCODONE-ACETAMINOPHEN 5-325 MG PO TABS
1.0000 | ORAL_TABLET | Freq: Once | ORAL | Status: AC
  Administered 2011-08-26: 1 via ORAL
  Filled 2011-08-26: qty 1

## 2011-08-26 MED ORDER — OXYCODONE-ACETAMINOPHEN 5-325 MG PO TABS
1.0000 | ORAL_TABLET | Freq: Once | ORAL | Status: AC
  Administered 2011-08-26: 1 via ORAL

## 2011-08-26 MED ORDER — SODIUM CHLORIDE 0.9 % IV BOLUS (SEPSIS): 1000.0000 mL | Freq: Once | INTRAVENOUS | Status: DC

## 2011-08-26 MED ORDER — MORPHINE SULFATE 4 MG/ML IJ SOLN
4.0000 mg | Freq: Once | INTRAMUSCULAR | Status: DC
  Filled 2011-08-26: qty 1

## 2011-08-26 MED ORDER — CEPHALEXIN 500 MG PO CAPS
500.0000 mg | ORAL_CAPSULE | Freq: Once | ORAL | Status: AC
  Administered 2011-08-26: 500 mg via ORAL
  Filled 2011-08-26: qty 1

## 2011-08-26 MED ORDER — OXYCODONE-ACETAMINOPHEN 5-325 MG PO TABS
ORAL_TABLET | ORAL | Status: AC
  Filled 2011-08-26: qty 1

## 2011-08-26 NOTE — ED Provider Notes (Signed)
Melah Ebling S 8:00 PM patient discussed in sign out with Fayrene Helper, PAC.  Patient currently a resident at care facility for psychiatric illness. She presented yesterday to any pain hospital after inserting and plan upper urethra. X-rays initially showed the pin in the bladder. Patient has a followup appointment with urology specialist in [place].  Anna Livers S 6 AM patient rested well through the night. Signout given to [employer] PAC.  Patient still waiting coordination with social worker and case manager for placement.  Angus Seller, [place] 08/27/11 [phone]

## 2011-08-26 NOTE — ED Notes (Signed)
In and out performed, 300cc yellow urine with red streak of clots returned. Urine send to lab

## 2011-08-26 NOTE — ED Notes (Signed)
IV team RN never called back, Morphine 4 mg IV changed to percocet for faster pain management.

## 2011-08-26 NOTE — ED Provider Notes (Signed)
History    patient presents with a chief complaints of foreign body in bladder. Patient states she was depressed yesterday and was missing her father, and therefore proceed to insert a pen into her urethra.  she subsequently complained of pain and inability to urinate. She was seen last night at [facility] ED and  x-ray confirmed that she had indeed has a foreign object in her bladder.  She was instructed to follow up with Dr. Paulino Door (urologist) for further management.  Pt returns to ED today complaining of increasing lower abd pain.  She denies headache, fever, cp, sob.  She has not follow up with her doctor yet.  Please note, pt has hx of self multilating behaviors and has repeated attempt of inserting foreign object through urethra, which requires surgical intervention.  Patient denies homicidal or suicidal ideation. She also states she will not repeat the same behavior again.    CSN: [id number] Arrival date & time: 08/26/2011 11:38 AM   First MD Initiated Contact with Patient 08/26/11 1218      Chief Complaint  Patient presents with  . Ink Pen in Bladder/Urethra     (Consider location/radiation/quality/duration/timing/severity/associated sxs/prior treatment) HPI  Past Medical History  Diagnosis Date  . Asthma   . Hyperlipidemia, acquired   . Borderline personality disorder   . Depression     Past Surgical History  Procedure Date  . Open cystotomy   . No past surgeries     No family history on file.  History  Substance Use Topics  . Smoking status: Smoker, Current Status Unknown -- 2.0 packs/day    Types: Cigarettes  . Smokeless tobacco: Current User   Comment: unknown at this time  . Alcohol Use: No    OB History    Grav Para Term Preterm Abortions TAB SAB Ect Mult Living                  Review of Systems  All other systems reviewed and are negative.    Allergies  Haldol; Doxycycline; and Tetracyclines & related  Home Medications   Current Outpatient Rx    Name Route Sig Dispense Refill  . ASPIRIN 81 MG PO TABS Oral Take 81 mg by mouth daily.      Marland Kitchen DOCUSATE SODIUM 100 MG PO CAPS Oral Take 100 mg by mouth 2 (two) times daily.      . OMEGA-3 FATTY ACIDS 1000 MG PO CAPS Oral Take 1 g by mouth daily.      Marland Kitchen LORAZEPAM 2 MG PO TABS Oral Take 2 mg by mouth at bedtime.      Marland Kitchen MEDROXYPROGESTERONE ACETATE 150 MG/ML IM SUSP Intramuscular Inject 150 mg into the muscle every 3 (three) months.      Marland Kitchen SIMVASTATIN 20 MG PO TABS Oral Take 20 mg by mouth daily.      . CEPHALEXIN 500 MG PO CAPS Oral Take 1 capsule (500 mg total) by mouth 4 (four) times daily. 28 capsule 0  . CITALOPRAM HYDROBROMIDE 20 MG PO TABS Oral Take 1 tablet (20 mg total) by mouth daily. 30 tablet 0  . TIOTROPIUM BROMIDE MONOHYDRATE 18 MCG IN CAPS Inhalation Place 18 mcg into inhaler and inhale daily.      . TRAZODONE HCL 50 MG PO TABS Oral Take 0.5 tablets (25 mg total) by mouth at bedtime. 15 tablet 0  . ZIPRASIDONE HCL 40 MG PO CAPS Oral Take 1 capsule (40 mg total) by mouth 2 (two) times daily with  a meal. 60 capsule 0    BP 119/52  Pulse 56  Temp(Src) 98.8 F (37.1 C) (Oral)  Resp 16  SpO2 97%  Physical Exam  Nursing note and vitals reviewed. Constitutional: She appears well-developed and well-nourished.       obese  Eyes: Conjunctivae are normal.  Neck: Normal range of motion. Neck supple.  Cardiovascular: Normal rate and regular rhythm.   Pulmonary/Chest: Effort normal and breath sounds normal.  Abdominal: Soft. There is tenderness in the suprapubic area. There is no rigidity and no guarding.  Genitourinary: There is tenderness around the vagina. No bleeding around the vagina. No foreign body around the vagina. No vaginal discharge found.    ED Course  Procedures (including critical care time)  Labs Reviewed - No data to display Dg Abd 1 View  08/25/2011  *RADIOLOGY REPORT*  Clinical Data: The patient reports placing an ink pen into her urinary tract.  ABDOMEN - 1  VIEW  Comparison: None.  Findings: Linear radiodense and radiolucent foreign body overlying the inferior pelvis, centered in the midline.  Normal bowel gas pattern.  Unremarkable bones.  IMPRESSION: Ink pen in the urinary bladder.  Original Report Authenticated By: Darrol Angel, M.D.     No diagnosis found.    MDM  Patient is scheduled to be seen by Dr. Jerre Simon a urologist today. I have reviewed the x-ray results from last night and it is evident that she does have a foreign object in the bladder. She does not have upper abdominal pain concerning for perforation at this time. She will likely need surgical intervention which will best manage by a urologist.  I have given her some pain medication here and will have her followup with her urologist as previously scheduled.  Patient appears to be in no acute distress.    2:06 PM Upon discharging patient, her living facility refused to take the patient back due to the extremely behaviors and history of noncompliance. Therefore, I will consult social worker to sleep and find placement for this patient. She does not need a behavioral consult at this time as her behaviors has been evaluated by behavioral health in the past.  2:24 PM I spoke with social worker who has been working on finding home placement for this patient. However, social worker request that the patient to have a behavioral ACT consult/telepsych prior to placement.    4:10 PM I have consulted with Dr. Isabel Caprice, urologist. Dr. Isabel Caprice does not think that this is an emergency that requires urgent surgical intervention.  He has agreed to follow up with patient either in the psych ED in the next couple of days or through outpt visit for further management of the retained foreign object in the bladder.  I have also discussed this with my attending is aware of plan. I have consult with the Behavioral ACT team in order to set up a psych consultation prior to placement.  At this time is no evidence  of bowel perforation. Patient is in no acute distress.  8:23 PM Moderate amount of time was spent to identify placement for the patient. Social work has contact her primary care facility, who has agreed to accept patient. However, the patient would likely need a higher level of care. Lyla Son [phone]) from ACT Team has been working diligently to find a higher level of care facility for this patient. However, he would likely need tomorrow before patient can be found. At this time, the concern is of possible perforation from  a foreign object. Therefore, I will continue to monitor patient in the ED until she can have adequate followup both with urology and with a new facility. I discussed this with my colleague, Ivonne Andrew, PA-C, who is agreed to continue to monitor patient care.  My attending is aware of the patient's condition.  Fayrene Helper, [place] 08/26/11 2027

## 2011-08-26 NOTE — ED Notes (Signed)
CSW met with pt to review whether a new ALF(assisted living facility) or group home placement was needed at time of discharge. Due to pt's long hx of mental illness, a psych consult was requested to determine whether inpatient psychiatric treatment was recommended. Psych consult recommended d/c to a level III group home/higher level of care than an ALF along with intensive outpatient treatment. CSW contacted Cynthia Phelps at a [facility] to see if pt was able to return after discharge. Cynthia Phelps [phone]) stated that pt is able to return, however they feel the pt needs a higher level of care. CSW contacted Cynthia Phelps [phone] or [phone]), pt's father/legal guardian (papers in chart), to discuss d/c plans. Cynthia Phelps stated that pt has a targeted case Production designer, theatre/television/film through [employer] [place], Cynthia Phelps [phone]), and Cynthia Phelps along with the [facility] met today to discuss placement options for the patient to go to a higher level of care. CSW has left a message for Cynthia Phelps to call back to provide further information regarding safe/appropriate placement at time of discharge. CSW discussed with Cynthia Norman, PA and RN information [place] provided. Cynthia Phelps stated that he is concerned for the pt to be d/c before the pen is removed from the bladder and is requesting that a RN or EDP call him to discuss any upcoming procedures. CSW will continue to follow pt to secure appropriate placement at time of discharge.

## 2011-08-26 NOTE — ED Notes (Signed)
Patient ambulated to bathroom; gait steady; urine specimen obtained and sent to lab by McFatter, EMT.

## 2011-08-26 NOTE — Discharge Planning (Signed)
ED CM contacted by ED RN. Review reason for admission. Stating pt present facility expressed not wanting pt to return.  CM referred ED RN to ED SW.

## 2011-08-26 NOTE — ED Notes (Signed)
IV team paged three times already, IV RN has not paged me back yet. Pt is now walking to restroom, instructed that we need a urine sample. Pt sts she will try

## 2011-08-26 NOTE — Progress Notes (Signed)
Patient's nurse discussed with CSW group home placement assistance as it is reported patient is unable to return.  CSW reviewed patient's chart and prior to placement being sought, patient will need psych eval by ACT team.  CSW communicated this to nurse who expressed understanding.  Ileene Hutchinson , MSW, LCSWA 08/26/2011 2:21 PM

## 2011-08-26 NOTE — ED Notes (Signed)
Pt states that she placed an ink pen in her urethra yesterday evening around 6pm in an attempt to hurt herself because she was missing her father. She states that she has done this before. Pt was seen at [facility] yesterday and an Xray showed that there was indeed a foreign body between her bladder and urethra. They stated that they were going to keep her but discharged her instead so they decided to come here this morning. She was given a prescription for Keflex but has not had it filled yet.

## 2011-08-26 NOTE — ED Provider Notes (Signed)
Medical screening examination/treatment/procedure(s) were performed by non-physician practitioner and as supervising physician I was immediately available for consultation/collaboration.   Nat Christen, MD 08/26/11 [phone]

## 2011-08-26 NOTE — ED Notes (Signed)
Upon brief exam of genital area, no redness or bleeding noted.  Last urination was 2 hours ago where she only "trickled" the urine out.  Pt "shoved" the sharp end of pen up first.  States she enjoys the pain when she first puts it in but does not enjoy the pain that follows.  States that every time she has ever done this, it has been for the same reason.  Pt began doing this at the age of 57.  States that if she could have sex, it would take the place of the feeling she wants from the ink pen in her urethra.  "I masturbate too but I don't know what makes me want to do the ink pen thing.  I don't have anything to masturbate with".

## 2011-08-26 NOTE — ED Notes (Signed)
Assisted dr, in room during vaginal examination. Pt alert and oriented x4. in no acute distress. Requesting pain medication.

## 2011-08-26 NOTE — ED Notes (Signed)
Spoke with patient's father, Viviane Semidey, [phone]. Updated on patient's status and informed that patient continues to await evaluation by Urology.

## 2011-08-27 ENCOUNTER — Encounter (HOSPITAL_COMMUNITY): Payer: Self-pay | Admitting: Internal Medicine

## 2011-08-27 DIAGNOSIS — J45909 Unspecified asthma, uncomplicated: Secondary | ICD-10-CM | POA: Insufficient documentation

## 2011-08-27 DIAGNOSIS — T199XXA Foreign body in genitourinary tract, part unspecified, initial encounter: Secondary | ICD-10-CM | POA: Diagnosis present

## 2011-08-27 LAB — DIFFERENTIAL
Basophils Relative: 0 % (ref 0–1)
Eosinophils Absolute: 0.2 10*3/uL (ref 0.0–0.7)
Lymphs Abs: 2.9 10*3/uL (ref 0.7–4.0)
Monocytes Absolute: 0.6 10*3/uL (ref 0.1–1.0)
Monocytes Relative: 6 % (ref 3–12)

## 2011-08-27 LAB — CBC
RDW: 13.2 % (ref 11.5–15.5)
WBC: 10.5 10*3/uL (ref 4.0–10.5)

## 2011-08-27 LAB — BASIC METABOLIC PANEL
BUN: 16 mg/dL (ref 6–23)
Creatinine, Ser: 0.76 mg/dL (ref 0.50–1.10)
GFR calc Af Amer: >90 mL/min (ref >90)
GFR calc non Af Amer: >90 mL/min (ref >90)
Glucose, Bld: 99 mg/dL (ref 70–99)

## 2011-08-27 MED ORDER — TIOTROPIUM BROMIDE MONOHYDRATE 18 MCG IN CAPS
18.0000 ug | ORAL_CAPSULE | Freq: Every day | RESPIRATORY_TRACT | Status: DC
Start: 2011-08-27 — End: 2011-09-01
  Administered 2011-08-28 – 2011-09-01 (×4): 18 ug via RESPIRATORY_TRACT
  Filled 2011-08-27 (×2): qty 5

## 2011-08-27 MED ORDER — ONDANSETRON HCL 4 MG PO TABS: 4.0000 mg | ORAL_TABLET | Freq: Three times a day (TID) | ORAL | Status: DC | PRN

## 2011-08-27 MED ORDER — DOCUSATE SODIUM 100 MG PO CAPS
100.0000 mg | ORAL_CAPSULE | Freq: Two times a day (BID) | ORAL | Status: DC
  Administered 2011-08-27 – 2011-09-01 (×10): 100 mg via ORAL
  Filled 2011-08-27 (×13): qty 1

## 2011-08-27 MED ORDER — TRAZODONE 25 MG HALF TABLET
25.0000 mg | ORAL_TABLET | Freq: Every day | ORAL | Status: DC
  Administered 2011-08-27: 25 mg via ORAL
  Administered 2011-08-28 – 2011-08-29 (×2): via ORAL
  Administered 2011-08-30 – 2011-08-31 (×2): 25 mg via ORAL
  Filled 2011-08-27 (×7): qty 1

## 2011-08-27 MED ORDER — ALUM & MAG HYDROXIDE-SIMETH 200-200-20 MG/5ML PO SUSP: 30.0000 mL | ORAL | Status: DC | PRN

## 2011-08-27 MED ORDER — ACETAMINOPHEN 325 MG PO TABS: 650.0000 mg | ORAL_TABLET | ORAL | Status: DC | PRN

## 2011-08-27 MED ORDER — SODIUM CHLORIDE 0.9 % IJ SOLN
3.0000 mL | Freq: Two times a day (BID) | INTRAMUSCULAR | Status: DC
  Administered 2011-08-27 – 2011-08-29 (×4): 3 mL via INTRAVENOUS

## 2011-08-27 MED ORDER — LORAZEPAM 1 MG PO TABS: 1.0000 mg | ORAL_TABLET | Freq: Three times a day (TID) | ORAL | Status: DC | PRN

## 2011-08-27 MED ORDER — HYDROCODONE-ACETAMINOPHEN 5-325 MG PO TABS
2.0000 | ORAL_TABLET | ORAL | Status: DC | PRN
  Administered 2011-08-27 – 2011-09-01 (×9): 2 via ORAL
  Filled 2011-08-27 (×9): qty 2

## 2011-08-27 MED ORDER — SODIUM CHLORIDE 0.9 % IV SOLN: INTRAVENOUS | Status: DC

## 2011-08-27 MED ORDER — OXYCODONE-ACETAMINOPHEN 5-325 MG PO TABS
1.0000 | ORAL_TABLET | Freq: Once | ORAL | Status: AC
  Administered 2011-08-27: 1 via ORAL
  Filled 2011-08-27: qty 1

## 2011-08-27 MED ORDER — HYDROMORPHONE HCL PF 1 MG/ML IJ SOLN: 1.0000 mg | INTRAMUSCULAR | Status: AC | PRN

## 2011-08-27 MED ORDER — ZOLPIDEM TARTRATE 5 MG PO TABS: 5.0000 mg | ORAL_TABLET | Freq: Every evening | ORAL | Status: DC | PRN

## 2011-08-27 MED ORDER — LORAZEPAM 1 MG PO TABS
2.0000 mg | ORAL_TABLET | Freq: Every day | ORAL | Status: DC
  Administered 2011-08-27 – 2011-08-31 (×5): 2 mg via ORAL
  Filled 2011-08-27 (×5): qty 2

## 2011-08-27 MED ORDER — HYDROCODONE-ACETAMINOPHEN 5-325 MG PO TABS
2.0000 | ORAL_TABLET | ORAL | Status: DC | PRN
  Administered 2011-08-27: 2 via ORAL
  Filled 2011-08-27: qty 2

## 2011-08-27 MED ORDER — ZIPRASIDONE HCL 40 MG PO CAPS
40.0000 mg | ORAL_CAPSULE | Freq: Two times a day (BID) | ORAL | Status: DC
  Administered 2011-08-28 – 2011-09-01 (×10): 40 mg via ORAL
  Filled 2011-08-27 (×13): qty 1

## 2011-08-27 MED ORDER — ONDANSETRON HCL 4 MG/2ML IJ SOLN: 4.0000 mg | Freq: Three times a day (TID) | INTRAMUSCULAR | Status: AC | PRN

## 2011-08-27 MED ORDER — CITALOPRAM HYDROBROMIDE 20 MG PO TABS
20.0000 mg | ORAL_TABLET | Freq: Every day | ORAL | Status: DC
  Administered 2011-08-27 – 2011-09-01 (×6): 20 mg via ORAL
  Filled 2011-08-27 (×8): qty 1

## 2011-08-27 MED ORDER — HYDROMORPHONE HCL PF 1 MG/ML IJ SOLN: 1.0000 mg | INTRAMUSCULAR | Status: DC | PRN

## 2011-08-27 MED ORDER — SIMVASTATIN 20 MG PO TABS
20.0000 mg | ORAL_TABLET | Freq: Every day | ORAL | Status: DC
  Administered 2011-08-27 – 2011-08-31 (×5): 20 mg via ORAL
  Filled 2011-08-27 (×8): qty 1

## 2011-08-27 MED ORDER — CIPROFLOXACIN IN D5W 400 MG/200ML IV SOLN
400.0000 mg | Freq: Two times a day (BID) | INTRAVENOUS | Status: DC
  Administered 2011-08-27 – 2011-08-29 (×4): 400 mg via INTRAVENOUS
  Filled 2011-08-27 (×6): qty 200

## 2011-08-27 NOTE — Progress Notes (Signed)
I spoke to Dr. Radford Pax regarding this currently [place]. She currently has minimal to no symptoms but I think the circumstances she should be admitted for the following reasons  First she can be safely observed in the hospital to make certain she does not become acutely ill from the foreign body   A second and most important is that in my opinion she should be seen by psychiatry and transferred from this hospital to a different facility that can help her with her underlying psychiatric problem  It is highly likely that the foreign body is removed that she will replace it possibly within hours or days. This will be her third anesthetic for the same reason in the last few weeks.  Dr. Ailene Ards was going to speak to the hospitalist team and I think consult psychiatry. Her foreign body will be removed at 11 AM on Friday by Dr. Mena Goes.

## 2011-08-27 NOTE — ED Notes (Signed)
Urologist in to see patient

## 2011-08-27 NOTE — ED Notes (Addendum)
Patient transported via w/c to 5E. No complaints

## 2011-08-27 NOTE — ED Notes (Signed)
Spoke with Dr. Radford Pax regarding request for psychiatric consult. ED secretary was informed by [facility] Assessment staff that no psych consults would be conducted in the ED by [facility] psychiatrist with pt's being admitted IPT and was instructed to engage ACT. Spoke with Dr. Radford Pax (EDP) who confirmed he wanted psychiatrist consult once patient was admitted to hospital. Informed him I would get the consult requested for set up once pt is admitted. TC with Yolanda at [facility] to put request in for IPT consult.

## 2011-08-27 NOTE — Progress Notes (Addendum)
Patient discussed in detail with Dr. Radford Pax. Awaiting Urologist to see patient with possible admit to hospital. Spoke with Sherlene at the ALF- will accept patient back and continue to work with obtaining a level 3 group home placement. Will continue to monitor for disposition. Lorri Frederick. Crowder,BSW SW Intern  08/27/11 12:27 pm  Note was reviewed by this writer who is in agreement with contents and disposition.  Ileene Hutchinson , MSW, LCSWA 08/27/2011 12:29 PM

## 2011-08-27 NOTE — Consult Note (Signed)
Urology Consult  Referring physician:[facility] Reason for referral: Pen in bladder  Chief Complaint: inserted pen in bladder; recurrent problem  History of Present Illness: psychiatric patient who inserts foreign bodies in urethra; did same today;  The patient placed a pen in her bladder yesterday. She's having little to no pain. She denies having a fever. She is small-volume frequency Modifying factors: There are no other modifying factors  Associated signs and symptoms: There are no other associated signs and symptoms Aggravating and relieving factors: There are no other aggravating or relieving factors Severity: Moderate Duration: Persistent   Past Medical History  Diagnosis Date  . Asthma   . Hyperlipidemia, acquired   . Borderline personality disorder   . Depression    Past Surgical History  Procedure Date  . Open cystotomy   . No past surgeries     Medications: I have reviewed the patient's current medications. Allergies:  Allergies  Allergen Reactions  . Haldol (Haloperidol Decanoate) Shortness Of Breath and Other (See Comments)    Freezes up and tongue sticks out ,Unable to move arms and legs, muscle stiffness   . Latex     Sensitivity, not severe  . Doxycycline Itching, Rash and Other (See Comments)    Unable to move arms and legs, muscle stiffness  . Tetracyclines & Related Itching, Rash and Other (See Comments)    Unable to move arms and legs, muscle stiffness    No family history on file. Social History:  reports that she has been smoking Cigarettes.  She has been smoking about 2 packs per day. She uses smokeless tobacco. She reports that she does not drink alcohol or use illicit drugs.  ROS: All systems are reviewed and negative except as noted.   Physical Exam:  Vital signs in last 24 hours: Temp:  [98 F (36.7 C)-98.7 F (37.1 C)] 98.7 F (37.1 C) (11/14 1225) Pulse Rate:  [60-74] 69  (11/14 1225) Resp:  [16-18] 18  (11/14 1225) BP:  (100-135)/(45-78) 118/59 mmHg (11/14 1225) SpO2:  [96 %-100 %] 100 % (11/14 1225)  Cardiovascular: Skin warm; not flushed Respiratory: Breaths quiet; no shortness of breath Abdomen: No masses Neurological: Normal sensation to touch Musculoskeletal: Normal motor function arms and legs Lymphatics: No inguinal adenopathy Skin: No rashes Genitourinary: No abdominal tenderness; no peritoneal finding  Laboratory Data:  Results for orders placed during the hospital encounter of 08/26/11 (from the past 72 hour(s))  CBC     Status: Abnormal   Collection Time   08/26/11  2:40 PM      Component Value Range Comment   WBC 11.3 (*) 4.0 - 10.5 (K/uL)    RBC 4.70  3.87 - 5.11 (MIL/uL)    Hemoglobin 14.0  12.0 - 15.0 (g/dL)    HCT 40.9  81.1 - 91.4 (%)    MCV 88.1  78.0 - 100.0 (fL)    MCH 29.8  26.0 - 34.0 (pg)    MCHC 33.8  30.0 - 36.0 (g/dL)    RDW 78.2  95.6 - 21.3 (%)    Platelets 287  150 - 400 (K/uL)   COMPREHENSIVE METABOLIC PANEL     Status: Abnormal   Collection Time   08/26/11  2:40 PM      Component Value Range Comment   Sodium 137  135 - 145 (mEq/L)    Potassium 4.6  3.5 - 5.1 (mEq/L)    Chloride 104  96 - 112 (mEq/L)    CO2 25  19 -  32 (mEq/L)    Glucose, Bld 79  70 - 99 (mg/dL)    BUN 13  6 - 23 (mg/dL)    Creatinine, Ser 1.61  0.50 - 1.10 (mg/dL)    Calcium 9.7  8.4 - 10.5 (mg/dL)    Total Protein 7.0  6.0 - 8.3 (g/dL)    Albumin 3.5  3.5 - 5.2 (g/dL)    AST 18  0 - 37 (U/L)    ALT 30  0 - 35 (U/L)    Alkaline Phosphatase 104  39 - 117 (U/L)    Total Bilirubin 0.1 (*) 0.3 - 1.2 (mg/dL)    GFR calc non Af Amer >90  >90 (mL/min)    GFR calc Af Amer >90  >90 (mL/min)   ETHANOL     Status: Normal   Collection Time   08/26/11  2:40 PM      Component Value Range Comment   Alcohol, Ethyl (B) <11  0 - 11 (mg/dL)   URINE RAPID DRUG SCREEN (HOSP PERFORMED)     Status: Normal   Collection Time   08/26/11  5:36 PM      Component Value Range Comment   Opiates NONE DETECTED   NONE DETECTED     Cocaine NONE DETECTED  NONE DETECTED     Benzodiazepines NONE DETECTED  NONE DETECTED     Amphetamines NONE DETECTED  NONE DETECTED     Tetrahydrocannabinol NONE DETECTED  NONE DETECTED     Barbiturates NONE DETECTED  NONE DETECTED    POCT PREGNANCY, URINE     Status: Normal   Collection Time   08/26/11  5:44 PM      Component Value Range Comment   Preg Test, Ur NEGATIVE     URINALYSIS, ROUTINE W REFLEX MICROSCOPIC     Status: Abnormal   Collection Time   08/26/11  9:13 PM      Component Value Range Comment   Color, Urine AMBER (*) YELLOW  BIOCHEMICALS MAY BE AFFECTED BY COLOR   Appearance CLOUDY (*) CLEAR     Specific Gravity, Urine 1.028  1.005 - 1.030     pH 6.0  5.0 - 8.0     Glucose, UA NEGATIVE  NEGATIVE (mg/dL)    Hgb urine dipstick LARGE (*) NEGATIVE     Bilirubin Urine NEGATIVE  NEGATIVE     Ketones, ur 40 (*) NEGATIVE (mg/dL)    Protein, ur 096 (*) NEGATIVE (mg/dL)    Urobilinogen, UA 0.2  0.0 - 1.0 (mg/dL)    Nitrite NEGATIVE  NEGATIVE     Leukocytes, UA SMALL (*) NEGATIVE    URINE MICROSCOPIC-ADD ON     Status: Abnormal   Collection Time   08/26/11  9:13 PM      Component Value Range Comment   Squamous Epithelial / LPF MANY (*) RARE     WBC, UA 7-10  <3 (WBC/hpf)    RBC / HPF TOO NUMEROUS TO COUNT  <3 (RBC/hpf)    Bacteria, UA MANY (*) RARE     Urine-Other MUCOUS PRESENT     CBC     Status: Normal   Collection Time   08/27/11 12:00 PM      Component Value Range Comment   WBC 10.5  4.0 - 10.5 (K/uL)    RBC 4.49  3.87 - 5.11 (MIL/uL)    Hemoglobin 12.9  12.0 - 15.0 (g/dL)    HCT 04.5  40.9 - 81.1 (%)    MCV 86.9  78.0 - 100.0 (fL)    MCH 28.7  26.0 - 34.0 (pg)    MCHC 33.1  30.0 - 36.0 (g/dL)    RDW 54.0  98.1 - 19.1 (%)    Platelets 297  150 - 400 (K/uL)   DIFFERENTIAL     Status: Normal   Collection Time   08/27/11 12:00 PM      Component Value Range Comment   Neutrophils Relative 66  43 - 77 (%)    Neutro Abs 6.9  1.7 - 7.7 (K/uL)      Lymphocytes Relative 27  12 - 46 (%)    Lymphs Abs 2.9  0.7 - 4.0 (K/uL)    Monocytes Relative 6  3 - 12 (%)    Monocytes Absolute 0.6  0.1 - 1.0 (K/uL)    Eosinophils Relative 2  0 - 5 (%)    Eosinophils Absolute 0.2  0.0 - 0.7 (K/uL)    Basophils Relative 0  0 - 1 (%)    Basophils Absolute 0.0  0.0 - 0.1 (K/uL)    No results found for this or any previous visit (from the past 240 hour(s)). Creatinine:  Basename 08/26/11 1440  CREATININE 0.75    Xrays: See report/chart KUB pos for pen  Impression/Assessment:  Pen in bladder; this Obeirne has a recurrent foreign body in her bladder. I examined the patient and spoke to her in detail. I spoke to Dr. Radford Pax an Dr Mena Goes. She will be managed as an outpatient with surgery Friday morning. She'll go home with ciprofloxacin. The emergency department has done an excellent job in communicating and helping out with the logistics of her surgery and treatment plan without admission    Plan:   I will have the surgery scheduled and relayed information to the emergency department.  Yon Schiffman A 08/27/2011, 12:31 PM

## 2011-08-27 NOTE — ED Notes (Signed)
Cook, MD at bedside.  

## 2011-08-27 NOTE — ED Provider Notes (Addendum)
I spoke with the urologist on-call Dr. Sherron Monday.  He or his partner will see the patient today for evaluation.  Still working on placement and preventive measures to prevent this from happening again.  May need to admit to hospitalist service will assess later.  Nelia Shi, MD 08/27/11 1117  Nelia Shi, MD 08/27/11 [phone]

## 2011-08-27 NOTE — ED Provider Notes (Signed)
Medical screening examination/treatment/procedure(s) were performed by non-physician practitioner and as supervising physician I was immediately available for consultation/collaboration.  Nicholes Stairs, MD 08/27/11 1539

## 2011-08-27 NOTE — Progress Notes (Signed)
[facility] once urologist and MD determine treatment plan. Facility is aware. Social Worker will sign off at this time.  Bradly Bienenstock, SW Intern 08/27/2011 1:21 PM  This writer has reviewed note above and is in agreement with contents and disposition.  Ileene Hutchinson , MSW, LCSWA 08/27/2011 1:29 PM

## 2011-08-27 NOTE — ED Provider Notes (Signed)
Medical screening examination/treatment/procedure(s) were performed by non-physician practitioner and as supervising physician I was immediately available for consultation/collaboration.   Jacobe Study A Aala Ransom, MD 08/27/11 1144 

## 2011-08-27 NOTE — ED Notes (Signed)
Called report to [facility]. Voiced understanding of report given.

## 2011-08-27 NOTE — H&P (Signed)
PCP:   Evelene Croon, MD   Chief Complaint:  Abdominal pain after inserting an ink pen into her bladder.  HPI: Patient is a 20 year old Caucasian female with history of hyperlipidemia, asthma, anxiety and depression who has had prior episodes of inserting foreign bodies into her bladder. She was recently admitted at the end of October by the urology service and had procedure done to remove such a foreign body. She indicates that 2 nights ago she felt bored and inserted an ink pen into her bladder. She says she was initially trying to masturbate using the pen and it just went to far. She brought this to the attention of the assisted living facility staff who transferred her to [facility] emergency room for further evaluation. Her case was discussed with the urologist who recommended discharging her from the emergency department and he would see her in the office for followup. She was discharged back to the assisted living facility. Her father who is said to be the healthcare power of attorney insisted that patient be transferred to the [facility] for further evaluation. The urologist have consulted and have scheduled her for surgery/procedure on Friday, November 16. They recommended inpatient hospitalization for close observation and also a psychiatric consultation given the repeated episodes off similar presentations. Patient complains of lower mid abdominal pain which is constant, cramping in nature which she claims is 10 over 10 in severity. She has some dysuria and hematuria. She complains of chills but no fevers.  Past Medical History: Past Medical History  Diagnosis Date  . Asthma   . Hyperlipidemia, acquired   . Borderline personality disorder   . Depression   . Anxiety     Past Surgical History: Past Surgical History  Procedure Date  . Open cystotomy   . No past surgeries     Allergies:   Allergies  Allergen Reactions  . Haldol (Haloperidol Decanoate)  Shortness Of Breath and Other (See Comments)    Freezes up and tongue sticks out ,Unable to move arms and legs, muscle stiffness   . Latex     Sensitivity, not severe  . Doxycycline Itching, Rash and Other (See Comments)    Unable to move arms and legs, muscle stiffness  . Tetracyclines & Related Itching, Rash and Other (See Comments)    Unable to move arms and legs, muscle stiffness    Medications: Prior to Admission medications   Medication Sig Start Date End Date Taking? Authorizing Provider  citalopram (CELEXA) 20 MG tablet Take 1 tablet (20 mg total) by mouth daily. 08/22/11  Yes Mechele Dawley, NP  docusate sodium (COLACE) 100 MG capsule Take 100 mg by mouth 2 (two) times daily.     Yes Historical Provider, MD  LORazepam (ATIVAN) 2 MG tablet Take 2 mg by mouth at bedtime.     Yes Historical Provider, MD  simvastatin (ZOCOR) 20 MG tablet Take 20 mg by mouth daily.     Yes Historical Provider, MD  tiotropium (SPIRIVA) 18 MCG inhalation capsule Place 18 mcg into inhaler and inhale daily.     Yes Historical Provider, MD  traZODone (DESYREL) 50 MG tablet Take 0.5 tablets (25 mg total) by mouth at bedtime. 08/22/11 09/21/11  Mechele Dawley, NP  ziprasidone (GEODON) 40 MG capsule Take 1 capsule (40 mg total) by mouth 2 (two) times daily with a meal. 08/22/11 11/20/11  Mechele Dawley, NP    Family History: History reviewed. No pertinent family history.  Social History:  reports that she has been smoking Cigarettes.  She has been smoking about 2 packs per day. She has never used smokeless tobacco. She reports that she does not drink alcohol or use illicit drugs.  Review of Systems:  All systems reviewed and apart from history of presenting illness is negative. Patient denies dyspnea or chest pain. No nausea vomiting. She denies illusions or hallucinations or suicidal or homicidal ideations.  Physical Exam: Filed Vitals:   08/27/11 0334 08/27/11 0808 08/27/11 1225 08/27/11 1510  BP: 100/45  110/53 118/59 123/68  Pulse: 67 74 69 68  Temp: 98 F (36.7 C) 98.7 F (37.1 C) 98.7 F (37.1 C) 99.3 F (37.4 C)  TempSrc: Oral Oral Oral Oral  Resp: 16 18 18 18   SpO2: 100% 97% 100% 98%   General exam: Patient is a moderately built and overweight who is lying comfortably on the gurney and is in no obvious distress. Head eyes and ENT: Nontraumatic, normocephalic. Pupils equally reacting to light and accommodation. Oral mucosa is moist with no acute findings. Lymphatics no lymphadenopathy. Neck: Supple. Respiratory system: Clear. No increased work of breathing Cardiovascular system: First and second heart sounds heard, regular. No JVD or murmur Gastrointestinal system: Abdomen is nondistended. Mild suprapubic tenderness but no rigidity guarding or rebound. No organomegaly or masses appreciated. Normal bowel sounds heard. Central nervous system: Awake alert oriented. No focal neurological deficits. Extremities: Symmetrical 5 x 5 power. Psychiatric: Patient is appropriate and pleasant. Genitourinary exam: Deferred.  Labs on Admission:   Basename 08/27/11 1200 08/26/11 1440  NA 135 137  K 3.7 4.6  CL 100 104  CO2 26 25  GLUCOSE 99 79  BUN 16 13  CREATININE 0.76 0.75  CALCIUM 9.5 9.7  MG -- --  PHOS -- --    Basename 08/26/11 1440  AST 18  ALT 30  ALKPHOS 104  BILITOT 0.1*  PROT 7.0  ALBUMIN 3.5   No results found for this basename: LIPASE:2,AMYLASE:2 in the last 72 hours  Basename 08/27/11 1200 08/26/11 1440  WBC 10.5 11.3*  NEUTROABS 6.9 --  HGB 12.9 14.0  HCT 39.0 41.4  MCV 86.9 88.1  PLT 297 287   No results found for this basename: CKTOTAL:3,CKMB:3,CKMBINDEX:3,TROPONINI:3 in the last 72 hours No results found for this basename: TSH,T4TOTAL,FREET3,T3FREE,THYROIDAB in the last 72 hours No results found for this basename: VITAMINB12:2,FOLATE:2,FERRITIN:2,TIBC:2,IRON:2,RETICCTPCT:2 in the last 72 hours  Radiological Exams on Admission: Dg Chest 2  View  08/22/2011  *RADIOLOGY REPORT*  Clinical Data: 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  History of tobacco smoking.  CHEST - 2 VIEW  Comparison: None.  Findings: The cardiac silhouette is normal size and shape. Mediastinal and hilar contours appear normal. The lungs are well aerated and free of infiltrates. No pleural abnormality is evident. There is slight scoliosis.  IMPRESSION: No acute or active cardiopulmonary or pleural abnormality is evident.  There is no evidence of tuberculosis.  Original Report Authenticated By: Crawford Givens, M.D.   Dg Abd 1 View  08/25/2011  *RADIOLOGY REPORT*  Clinical Data: The patient reports placing an ink pen into her urinary tract.  ABDOMEN - 1 VIEW  Comparison: None.  Findings: Linear radiodense and radiolucent foreign body overlying the inferior pelvis, centered in the midline.  Normal bowel gas pattern.  Unremarkable bones.  IMPRESSION: Ink pen in the urinary bladder.  Original Report Authenticated By: Darrol Angel, M.D.      Assessment/Plan 1. Abdominal pain secondary to an insertion of  an open ink pen in the bladder. Patient will be admitted to medical floor. Urology has already consulted and plans for procedure on November 16. They recommend empiric intravenous ciprofloxacin. We'll provide pain medications. We'll also place a Chief Executive Officer. 2. Hyperlipidemia: Continue home statins. 3. History of asthma: Patient indicates that she has infrequent attacks. No history of prior intubation. Stable. 4. History of anxiety, depression and borderline personality disorder. Psychiatry has been consulted by the emergency room physician. Patient indicates that she takes the above home medications which will be continued.  Montrelle Eddings 08/27/2011, 4:42 PM

## 2011-08-28 LAB — URINE CULTURE: Culture  Setup Time: 201211140921

## 2011-08-28 LAB — BASIC METABOLIC PANEL
GFR calc Af Amer: >90 mL/min (ref >90)
GFR calc non Af Amer: >90 mL/min (ref >90)
Potassium: 3.8 mEq/L (ref 3.5–5.1)
Sodium: 138 mEq/L (ref 135–145)

## 2011-08-28 LAB — CBC
Hemoglobin: 13.1 g/dL (ref 12.0–15.0)
MCHC: 31.9 g/dL (ref 30.0–36.0)
RBC: 4.67 MIL/uL (ref 3.87–5.11)

## 2011-08-28 MED ORDER — SENNA 8.6 MG PO TABS
2.0000 | ORAL_TABLET | Freq: Every evening | ORAL | Status: DC | PRN
  Administered 2011-08-29: 17.2 mg via ORAL
  Filled 2011-08-28: qty 2

## 2011-08-28 NOTE — Progress Notes (Signed)
Subjective: Patient indicates that her lower abdominal pain is better than yesterday. She complains of constipation.  Objective: Blood pressure 133/80, pulse 72, temperature 98.7 F (37.1 C), temperature source Oral, resp. rate 18, height 4\' 11"  (1.499 m), weight 102.2 kg (225 lb 5 oz), SpO2 98.00%.  Intake/Output Summary (Last 24 hours) at 08/28/11 1800 Last data filed at 08/28/11 1300  Gross per 24 hour  Intake    600 ml  Output      0 ml  Net    600 ml   General exam: Patient is lying comfortably in bed. Respiratory system: Clear Cardiovascular system: First and second heart sounds heard, regular. Gastrointestinal system: Abdomen is nondistended, soft, nontender and normal bowel sounds heard. Central nervous system: Patient is awake alert oriented and in no obvious distress. No focal deficits.  Lab Results: Basic Metabolic Panel:  Basename 08/28/11 0507 08/27/11 1200  NA 138 135  K 3.8 3.7  CL 103 100  CO2 26 26  GLUCOSE 78 99  BUN 10 16  CREATININE 0.71 0.76  CALCIUM 8.9 9.5  MG -- --  PHOS -- --   Liver Function Tests:  Basename 08/26/11 1440  AST 18  ALT 30  ALKPHOS 104  BILITOT 0.1*  PROT 7.0  ALBUMIN 3.5   No results found for this basename: LIPASE:2,AMYLASE:2 in the last 72 hours No results found for this basename: AMMONIA:2 in the last 72 hours CBC:  Basename 08/28/11 0507 08/27/11 1200  WBC 9.3 10.5  NEUTROABS -- 6.9  HGB 13.1 12.9  HCT 41.1 39.0  MCV 88.0 86.9  PLT 298 297   Cardiac Enzymes: No results found for this basename: CKTOTAL:3,CKMB:3,CKMBINDEX:3,TROPONINI:3 in the last 72 hours BNP: No results found for this basename: POCBNP:3 in the last 72 hours D-Dimer: No results found for this basename: DDIMER:2 in the last 72 hours CBG: No results found for this basename: GLUCAP:6 in the last 72 hours Hemoglobin A1C: No results found for this basename: HGBA1C in the last 72 hours Fasting Lipid Panel: No results found for this basename:  CHOL,HDL,LDLCALC,TRIG,CHOLHDL,LDLDIRECT in the last 72 hours Thyroid Function Tests: No results found for this basename: TSH,T4TOTAL,FREET4,T3FREE,THYROIDAB in the last 72 hours Anemia Panel: No results found for this basename: VITAMINB12,FOLATE,FERRITIN,TIBC,IRON,RETICCTPCT in the last 72 hours Coagulation: No results found for this basename: LABPROT:2,INR:2 in the last 72 hours Urine Drug Screen:  Alcohol Level:  Basename 08/26/11 1440  ETH <11   Urinalysis:  Misc. Labs:   Micro Results: Recent Results (from the past 240 hour(s))  URINE CULTURE     Status: Normal   Collection Time   08/26/11  9:13 PM      Component Value Range Status Comment   Specimen Description URINE, CLEAN CATCH   Final    Special Requests NONE   Final    Setup Time [id number]   Final    Colony Count >=100,000 COLONIES/ML   Final    Culture     Final    Value: Multiple bacterial morphotypes present, none predominant. Suggest appropriate recollection if clinically indicated.   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  History of tobacco smoking.  CHEST - 2 VIEW  Comparison: None.  Findings: The cardiac silhouette is normal size and shape. Mediastinal and hilar contours appear normal. The lungs are well aerated and free of infiltrates. No pleural abnormality is  evident. There is slight scoliosis.  IMPRESSION: No acute or active cardiopulmonary or pleural abnormality is evident.  There is no evidence of tuberculosis.  Original Report Authenticated By: Crawford Givens, M.D.   Dg Abd 1 View  08/25/2011  *RADIOLOGY REPORT*  Clinical Data: The patient reports placing an ink pen into her urinary tract.  ABDOMEN - 1 VIEW  Comparison: None.  Findings: Linear radiodense and radiolucent foreign body overlying the inferior pelvis, centered in the midline.  Normal bowel gas pattern.  Unremarkable  bones.  IMPRESSION: Ink pen in the urinary bladder.  Original Report Authenticated By: Darrol Angel, M.D.    Medications: Scheduled Meds:    . ciprofloxacin  400 mg Intravenous Q12H  . citalopram  20 mg Oral Daily  . docusate sodium  100 mg Oral BID  . LORazepam  2 mg Oral QHS  . simvastatin  20 mg Oral QHS  . sodium chloride  3 mL Intravenous Q12H  . tiotropium  18 mcg Inhalation Daily  . traZODone  25 mg Oral QHS  . ziprasidone  40 mg Oral BID WC   Continuous Infusions:  PRN Meds:.acetaminophen, HYDROcodone-acetaminophen, HYDROmorphone (DILAUDID) injection, ondansetron (ZOFRAN) IV, ondansetron, DISCONTD: alum & mag hydroxide-simeth, DISCONTD: HYDROcodone-acetaminophen, DISCONTD:  HYDROmorphone (DILAUDID) injection, DISCONTD: LORazepam, DISCONTD: zolpidem  Assessment/Plan: Patient Active Hospital Problem List: 1. Foreign body GU tract (08/27/2011)   Assessment: Patient's abdominal pain is better. Stable.    Plan: Urology plans to remove the foreign body by cystoscopy at 11 AM tomorrow. 2. Hyperlipidemia: Continue statins 3. History of asthma: Stable 4. History of anxiety, depression and borderline personality disorder: Psychiatry consultation is pending. Called behavioral health center to remind of consultation.    Junah Yam 08/28/2011, 6:00 PM

## 2011-08-28 NOTE — Progress Notes (Signed)
Vitals normal Laboratory tests normal Patient alert and stable No bladder pain Frequent urination Booked for Friday 11 am to remove pen cystoscopically

## 2011-08-28 NOTE — Plan of Care (Signed)
Problem: Phase I Progression Outcomes Goal: Initial discharge plan identified Outcome: Progressing Patient currently lives in a facility; plans to return there at thistime

## 2011-08-29 ENCOUNTER — Inpatient Hospital Stay (HOSPITAL_COMMUNITY): Payer: Medicaid Other | Admitting: Anesthesiology

## 2011-08-29 ENCOUNTER — Other Ambulatory Visit: Payer: Self-pay | Admitting: Urology

## 2011-08-29 ENCOUNTER — Encounter (HOSPITAL_COMMUNITY): Payer: Self-pay

## 2011-08-29 ENCOUNTER — Inpatient Hospital Stay (HOSPITAL_COMMUNITY): Admission: RE | Admit: 2011-08-29 | Payer: Medicaid Other | Source: Ambulatory Visit | Admitting: Urology

## 2011-08-29 ENCOUNTER — Encounter (HOSPITAL_COMMUNITY): Payer: Self-pay | Admitting: Anesthesiology

## 2011-08-29 ENCOUNTER — Inpatient Hospital Stay (HOSPITAL_COMMUNITY): Payer: Medicaid Other

## 2011-08-29 ENCOUNTER — Encounter (HOSPITAL_COMMUNITY)
Admission: EM | Disposition: A | Discharge status: Nursing Home (Includes ICF) | Payer: Self-pay | Source: Home / Self Care | Attending: Internal Medicine

## 2011-08-29 HISTORY — PX: FOREIGN BODY REMOVAL: SHX962

## 2011-08-29 HISTORY — PX: CYSTOSCOPY: SHX5120

## 2011-08-29 SURGERY — FOREIGN BODY REMOVAL ADULT
Anesthesia: General | Wound class: Clean Contaminated

## 2011-08-29 MED ORDER — LIDOCAINE HCL 1 % IJ SOLN
INTRAMUSCULAR | Status: DC | PRN
  Administered 2011-08-29: 100 mg via INTRADERMAL

## 2011-08-29 MED ORDER — IOHEXOL 300 MG/ML  SOLN
INTRAMUSCULAR | Status: DC | PRN
  Administered 2011-08-29: 50 mL via INTRAVENOUS

## 2011-08-29 MED ORDER — SULFAMETHOXAZOLE-TMP DS 800-160 MG PO TABS
1.0000 | ORAL_TABLET | Freq: Two times a day (BID) | ORAL | Status: DC
  Administered 2011-08-29 – 2011-08-31 (×5): 1 via ORAL
  Filled 2011-08-29 (×7): qty 1

## 2011-08-29 MED ORDER — LACTATED RINGERS IV SOLN
INTRAVENOUS | Status: AC
  Administered 2011-08-29: 1000 mL via INTRAVENOUS

## 2011-08-29 MED ORDER — ONDANSETRON HCL 4 MG/2ML IJ SOLN
INTRAMUSCULAR | Status: DC | PRN
  Administered 2011-08-29: 4 mg via INTRAVENOUS

## 2011-08-29 MED ORDER — SODIUM CHLORIDE 0.9 % IR SOLN
Status: DC | PRN
  Administered 2011-08-29: 3000 mL

## 2011-08-29 MED ORDER — PROPOFOL 10 MG/ML IV EMUL
INTRAVENOUS | Status: DC | PRN
  Administered 2011-08-29: 200 mg via INTRAVENOUS

## 2011-08-29 MED ORDER — CEFAZOLIN SODIUM-DEXTROSE 2-3 GM-% IV SOLR
2.0000 g | Freq: Once | INTRAVENOUS | Status: AC
  Administered 2011-08-29: 2 g via INTRAVENOUS

## 2011-08-29 MED ORDER — PROMETHAZINE HCL 25 MG/ML IJ SOLN: 6.2500 mg | INTRAMUSCULAR | Status: DC | PRN

## 2011-08-29 MED ORDER — IOHEXOL 300 MG/ML  SOLN
INTRAMUSCULAR | Status: AC
  Filled 2011-08-29: qty 1

## 2011-08-29 MED ORDER — CEFAZOLIN SODIUM 1-5 GM-% IV SOLN: 1.0000 g | INTRAVENOUS | Status: DC

## 2011-08-29 MED ORDER — CEFAZOLIN SODIUM-DEXTROSE 2-3 GM-% IV SOLR
INTRAVENOUS | Status: AC
  Filled 2011-08-29: qty 50

## 2011-08-29 MED ORDER — MIDAZOLAM HCL 5 MG/5ML IJ SOLN
INTRAMUSCULAR | Status: DC | PRN
  Administered 2011-08-29: 2 mg via INTRAVENOUS

## 2011-08-29 MED ORDER — LACTATED RINGERS IV SOLN: INTRAVENOUS | Status: DC

## 2011-08-29 MED ORDER — FENTANYL CITRATE 0.05 MG/ML IJ SOLN: 25.0000 ug | INTRAMUSCULAR | Status: DC | PRN

## 2011-08-29 MED ORDER — DIATRIZOATE MEGLUMINE 30 % UR SOLN
URETHRAL | Status: DC | PRN
  Administered 2011-08-29: 300 mL

## 2011-08-29 MED ORDER — FENTANYL CITRATE 0.05 MG/ML IJ SOLN
INTRAMUSCULAR | Status: DC | PRN
  Administered 2011-08-29: 100 ug via INTRAVENOUS
  Administered 2011-08-29 (×3): 50 ug via INTRAVENOUS

## 2011-08-29 MED ORDER — LACTATED RINGERS IV SOLN
INTRAVENOUS | Status: DC | PRN
  Administered 2011-08-29: 11:00:00 via INTRAVENOUS

## 2011-08-29 MED ORDER — DEXAMETHASONE SODIUM PHOSPHATE 10 MG/ML IJ SOLN
INTRAMUSCULAR | Status: DC | PRN
  Administered 2011-08-29: 10 mg via INTRAVENOUS

## 2011-08-29 SURGICAL SUPPLY — 11 items
BAG URO CATCHER STRL LF (DRAPE) ×2 IMPLANT
CATH SILICONE 5CC 18FR (INSTRUMENTS) ×2 IMPLANT
CLOTH BEACON ORANGE TIMEOUT ST (SAFETY) ×2 IMPLANT
CYSTOGRAFIN 30% 250ML (MISCELLANEOUS) ×2 IMPLANT
DRAPE CAMERA CLOSED 9X96 (DRAPES) ×2 IMPLANT
GLOVE BIOGEL M STRL SZ7.5 (GLOVE) ×2 IMPLANT
GOWN STRL NON-REIN LRG LVL3 (GOWN DISPOSABLE) ×2 IMPLANT
GOWN STRL REIN XL XLG (GOWN DISPOSABLE) ×2 IMPLANT
MANIFOLD NEPTUNE II (INSTRUMENTS) ×2 IMPLANT
PACK CYSTO (CUSTOM PROCEDURE TRAY) ×2 IMPLANT
TUBING CONNECTING 10 (TUBING) ×2 IMPLANT

## 2011-08-29 NOTE — Brief Op Note (Signed)
08/26/2011 - 08/29/2011  12:35 PM  PATIENT:  Cynthia Phelps  20 y.o. female  PRE-OPERATIVE DIAGNOSIS:  Foreign body in bladder  POST-OPERATIVE DIAGNOSIS:  foreign body in bladder  PROCEDURE:  Procedure(s): CYSTOSCOPY with FOREIGN BODY REMOVAL ADULT/Bic pen, medium Right retrograde pyelogram Cystogram  SURGEON:  Surgeon(s): Antony Haste, MD  PHYSICIAN ASSISTANT:   ASSISTANTS: none   ANESTHESIA:   general  Findings on cystoscopy -  Pen tip undermined mucosa above right UO, small plastic and piece of metal (staple?), possible left posterior cystotomy  Right retrograde pyelogram - normal mid and distal ureter without dilation, filling defect, or extravasation period, clear efflux  Cystogram - bladder filled with 400 mL contrast, no extravasation  EBL:   0  BLOOD ADMINISTERED:none  DRAINS: none   LOCAL MEDICATIONS USED:  NONE  SPECIMEN:  Bic, medium pen  DISPOSITION OF SPECIMEN:  PATHOLOGY  COUNTS:  YES  TOURNIQUET:  * No tourniquets in log *  DICTATION: .Other Dictation: Dictation Number [id number]  PLAN OF CARE: Admit to inpatient   PATIENT DISPOSITION:  PACU - hemodynamically stable.   Delay start of Pharmacological VTE agent (>24hrs) due to surgical blood loss or risk of bleeding:  {YES/NO/NOT APPLICABLE:20182

## 2011-08-29 NOTE — Progress Notes (Signed)
  Subjective: Patient without complaint.  Objective: Vital signs in last 24 hours: Temp:  [98.2 F (36.8 C)-98.7 F (37.1 C)] 98.2 F (36.8 C) (11/16 0610) Pulse Rate:  [56-72] 56  (11/16 0610) Resp:  [18] 18  (11/16 0610) BP: (108-133)/(61-80) 108/75 mmHg (11/16 0610) SpO2:  [97 %-98 %] 97 % (11/16 0610)  Intake/Output from previous day: 11/15 0701 - 11/16 0700 In: 1400 [P.O.:1200; IV Piggyback:200] Out: -  Intake/Output this shift:    Physical Exam:  NAD, alert Abd - soft, NT, ND  Lab Results:  Basename 08/28/11 0507 08/27/11 1200 08/26/11 1440  HGB 13.1 12.9 14.0  HCT 41.1 39.0 41.4   BMET  Basename 08/28/11 0507 08/27/11 1200  NA 138 135  K 3.8 3.7  CL 103 100  CO2 26 26  GLUCOSE 78 99  BUN 10 16  CREATININE 0.71 0.76  CALCIUM 8.9 9.5   No results found for this basename: LABPT:3,INR:3 in the last 72 hours No results found for this basename: LABURIN:1 in the last 72 hours Results for orders placed during the hospital encounter of 08/26/11  URINE CULTURE     Status: Normal   Collection Time   08/26/11  9:13 PM      Component Value Range Status Comment   Specimen Description URINE, CLEAN CATCH   Final    Special Requests NONE   Final    Setup Time [id number]   Final    Colony Count >=100,000 COLONIES/ML   Final    Culture     Final    Value: Multiple bacterial morphotypes present, none predominant. Suggest appropriate recollection if clinically indicated.   Report Status 08/28/2011 FINAL   Final    Abd film - pen in bladder    Assessment/Plan: Chart reviewed, patient examined, no change in H&P from GU point of view  Foreign body in bladder, Personality Disorder, Self mutalation  Plan cystoscopy foreign body removal possible open cystotomy   LOS: 3 days   Antony Haste 08/29/2011, 10:11 AM

## 2011-08-29 NOTE — Progress Notes (Signed)
Subjective: Mid lower abdominal pain. She says it's almost unchanged since yesterday. Says passing straw-colored urine with occasional streaks of blood.  Objective: Blood pressure 108/75, pulse 56, temperature 98.2 F (36.8 C), temperature source Oral, resp. rate 18, height 4\' 11"  (1.499 m), weight 102.2 kg (225 lb 5 oz), SpO2 97.00%.  Intake/Output Summary (Last 24 hours) at 08/29/11 0949 Last data filed at 08/29/11 0225  Gross per 24 hour  Intake   1160 ml  Output      0 ml  Net   1160 ml   General exam: Patient is sitting comfortably in bed. Respiratory system: Clear Cardiovascular system: First and second heart sounds heard, regular. Gastrointestinal system: Abdomen is nondistended, soft,and normal bowel sounds heard. Mild suprapubic area tenderness but no rigidity, guarding or rebound. Central nervous system: Patient is awake alert oriented and in no obvious distress. No focal deficits.  Lab Results: Basic Metabolic Panel:  Basename 08/28/11 0507 08/27/11 1200  NA 138 135  K 3.8 3.7  CL 103 100  CO2 26 26  GLUCOSE 78 99  BUN 10 16  CREATININE 0.71 0.76  CALCIUM 8.9 9.5  MG -- --  PHOS -- --   Liver Function Tests:  Basename 08/26/11 1440  AST 18  ALT 30  ALKPHOS 104  BILITOT 0.1*  PROT 7.0  ALBUMIN 3.5   No results found for this basename: LIPASE:2,AMYLASE:2 in the last 72 hours No results found for this basename: AMMONIA:2 in the last 72 hours CBC:  Basename 08/28/11 0507 08/27/11 1200  WBC 9.3 10.5  NEUTROABS -- 6.9  HGB 13.1 12.9  HCT 41.1 39.0  MCV 88.0 86.9  PLT 298 297   Cardiac Enzymes: No results found for this basename: CKTOTAL:3,CKMB:3,CKMBINDEX:3,TROPONINI:3 in the last 72 hours BNP: No results found for this basename: POCBNP:3 in the last 72 hours D-Dimer: No results found for this basename: DDIMER:2 in the last 72 hours CBG: No results found for this basename: GLUCAP:6 in the last 72 hours Hemoglobin A1C: No results found for this  basename: HGBA1C in the last 72 hours Fasting Lipid Panel: No results found for this basename: CHOL,HDL,LDLCALC,TRIG,CHOLHDL,LDLDIRECT in the last 72 hours Thyroid Function Tests: No results found for this basename: TSH,T4TOTAL,FREET4,T3FREE,THYROIDAB in the last 72 hours Anemia Panel: No results found for this basename: VITAMINB12,FOLATE,FERRITIN,TIBC,IRON,RETICCTPCT in the last 72 hours Coagulation: No results found for this basename: LABPROT:2,INR:2 in the last 72 hours Urine Drug Screen:  Alcohol Level:  Basename 08/26/11 1440  ETH <11   Urinalysis:  Misc. Labs:   Micro Results: Recent Results (from the past 240 hour(s))  URINE CULTURE     Status: Normal   Collection Time   08/26/11  9:13 PM      Component Value Range Status Comment   Specimen Description URINE, CLEAN CATCH   Final    Special Requests NONE   Final    Setup Time [id number]   Final    Colony Count >=100,000 COLONIES/ML   Final    Culture     Final    Value: Multiple bacterial morphotypes present, none predominant. Suggest appropriate recollection if clinically indicated.   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  History of tobacco smoking.  CHEST - 2 VIEW  Comparison: None.  Findings: The cardiac silhouette is normal size and shape. Mediastinal and hilar contours appear normal. The lungs  are well aerated and free of infiltrates. No pleural abnormality is evident. There is slight scoliosis.  IMPRESSION: No acute or active cardiopulmonary or pleural abnormality is evident.  There is no evidence of tuberculosis.  Original Report Authenticated By: Crawford Givens, M.D.   Dg Abd 1 View  08/25/2011  *RADIOLOGY REPORT*  Clinical Data: The patient reports placing an ink pen into her urinary tract.  ABDOMEN - 1 VIEW  Comparison: None.  Findings: Linear radiodense and radiolucent foreign body  overlying the inferior pelvis, centered in the midline.  Normal bowel gas pattern.  Unremarkable bones.  IMPRESSION: Ink pen in the urinary bladder.  Original Report Authenticated By: Darrol Angel, M.D.    Medications: Scheduled Meds:    . ciprofloxacin  400 mg Intravenous Q12H  . citalopram  20 mg Oral Daily  . docusate sodium  100 mg Oral BID  . LORazepam  2 mg Oral QHS  . simvastatin  20 mg Oral QHS  . sodium chloride  3 mL Intravenous Q12H  . tiotropium  18 mcg Inhalation Daily  . traZODone  25 mg Oral QHS  . ziprasidone  40 mg Oral BID WC   Continuous Infusions:  PRN Meds:.acetaminophen, HYDROcodone-acetaminophen, ondansetron, senna  Assessment/Plan: Patient Active Hospital Problem List: 1. Foreign body GU tract (08/27/2011)/urinary bladder    Assessment: Patient's continues to have abdominal pain. No signs of acute abdomen. Stable.    Plan: Urology plans to remove the foreign body by cystoscopy this morning. 2. Hyperlipidemia: Continue statins 3. History of asthma: Stable 4. History of anxiety, depression and borderline personality disorder: Psychiatry consultation is pending. Called behavioral health center and reminded of the consultation yesterday.    Stormy Sabol 08/29/2011, 9:49 AM

## 2011-08-29 NOTE — Transfer of Care (Signed)
Immediate Anesthesia Transfer of Care Note  Patient: Cynthia Phelps  Procedure(s) Performed:  FOREIGN BODY REMOVAL ADULT; CYSTOSCOPY - with right retrograde and cystogram  Patient Location: PACU  Anesthesia Type: General  Level of Consciousness: awake  Airway & Oxygen Therapy: Patient Spontanous Breathing  Post-op Assessment: Report given to PACU RN and Post -op Vital signs reviewed and stable  Post vital signs: stable  Complications: No apparent anesthesia complications

## 2011-08-29 NOTE — Op Note (Signed)
NAME:  Cynthia Phelps, Cynthia Phelps                      ACCOUNT NO.:  MEDICAL RECORD NO.:  [id number]  LOCATION:                                 FACILITY:  PHYSICIAN:  Jerilee Field, MD   DATE OF BIRTH:  1990/12/15  DATE OF PROCEDURE: DATE OF DISCHARGE:                              OPERATIVE REPORT   PREOPERATIVE DIAGNOSIS:  Foreign body in bladder.  POSTOPERATIVE DIAGNOSIS:  Foreign body in bladder.  PROCEDURE: 1. Cystoscopy with foreign body removal. 2. Right retrograde pyelogram and cystogram.  SURGEON:  Jerilee Field, MD  TYPE OF ANESTHESIA:  General.  INDICATION FOR PROCEDURE:  Cynthia Phelps is a 20 year old female who had placed a pen in her bladder visible on x-ray.  On cystoscopy, the urethra appeared normal.  There was a Bernards BIC-style ink pen in the patient's bladder tip that undermined the mucosa above the right ureteral orifice and lateral to it.  There was also a small dysplastic attached to a small spray gun metal, possibly from other foreign body placement.  There was breach of the mucosa on the posterior bladder with a possible cystotomy in this location.  The pen and small piece of metal were all removed without difficulty.  Reinspection of the bladder with a 30 and 70 degree lens revealed no other injuries and no other foreign bodies.  The bladder held normal irrigation, but a cystogram was performed.  DESCRIPTION OF PROCEDURE:  After consent was obtained, patient brought to the operating room.  A time-out was performed confirming the patient and procedure.  She was given preop antibiotics and SCDs were in place. A rigid cystoscope was passed per urethra.  There was a pen in her bladder.  The sharp tip had undermined the mucosa above the right ureteral orifice.  It was grasped with the biopsy, cold cup and was able to be repositioned in the bladder, but I could not get a good grip on the tip with ease.  Therefore, the lithotrite was passed.  It was large enough to  grab the very tip of the pen and as I pulled it toward the bladder neck and urethra, the pen straightened up and followed the scope out without difficulty.  Bladder was carefully inspected.  There was also noted to be a small black piece of plastic attached to a small thread of metal and this was grasped and removed.  Bladder was then inspected with a 30 and 70 degree lenses.  No other injuries were noted apart from possible cystotomy in the posterior of the bladder where there was a breach of the mucosa.  There were no other foreign bodies or injuries.  Therefore, the scope was removed and an 18-French Foley was placed, silicone.  Bladder was filled with gravity drainage and held 300 mL before it would fill them more.  Spot images were obtained.  I then under slight pressure filled the bladder to 400 mL and further imaging was taken.  Prior to the cystogram and after removal of foreign bodies, because the pen had gone lateral to the right ureteral orifice, to confirm the distal ureter had not been injured, a 6-French open-ended catheter  was placed in the right ureteral orifice and a right retrograde pyelogram was obtained outlining the mid and distal ureter.  The patient's bladder was drained.  She was awakened and then taken to the recovery room in stable condition.  RIGHT RETROGRADE PYELOGRAM:  Revealed a normal mid and distal ureter without dilation, filling defect, or extravasation.  After 6-French catheter was removed, there was brisk clear efflux from the right ureteral orifice.  CYSTOGRAM:  The bladder appeared smooth-walled with a filling defect at the Foley.  Final images were obtained with the balloon completely deflated and noted to be no extravasation.  Postdrainage image was obtained, which again showed no evidence of extravasation.  ESTIMATED BLOOD LOSS:  Zero.  DRAINS:  None.  SPECIMENS:  BIC medium pen.  Disposition to Path for gross ID only. Counts were  correct.  DISPOSITION:  Patient stable to PACU.          ______________________________ Jerilee Field, MD     ME/MEDQ  D:  08/29/2011  T:  08/29/2011  Job:  [id number]

## 2011-08-29 NOTE — Preoperative (Signed)
Beta Blockers   Reason not to administer Beta Blockers:Not Applicable 

## 2011-08-29 NOTE — Transfer of Care (Signed)
Immediate Anesthesia Transfer of Care Note  Patient: Cynthia Phelps  Procedure(s) Performed:  FOREIGN BODY REMOVAL ADULT; CYSTOSCOPY - with right retrograde and cystogram  Patient Location: PACU  Anesthesia Type: General  Level of Consciousness: alert , oriented and patient cooperative  Airway & Oxygen Therapy: Patient Spontanous Breathing and Patient connected to face mask  Post-op Assessment: Report given to PACU RN and Post -op Vital signs reviewed and stable  Post vital signs: Reviewed and stable  Complications: No apparent anesthesia complications

## 2011-08-29 NOTE — Anesthesia Postprocedure Evaluation (Signed)
  Anesthesia Post-op Note  Patient: Cynthia Phelps  Procedure(s) Performed:  FOREIGN BODY REMOVAL ADULT; CYSTOSCOPY - with right retrograde and cystogram  Patient Location: PACU  Anesthesia Type: General  Level of Consciousness: awake and alert   Airway and Oxygen Therapy: Patient Spontanous Breathing  Post-op Pain: mild  Post-op Assessment: Post-op Vital signs reviewed, Patient's Cardiovascular Status Stable, Respiratory Function Stable, Patent Airway and No signs of Nausea or vomiting  Post-op Vital Signs: stable  Complications: No apparent anesthesia complications

## 2011-08-29 NOTE — Anesthesia Preprocedure Evaluation (Addendum)
Anesthesia Evaluation  Patient identified by MRN, date of birth, ID band Patient awake    Reviewed: Allergy & Precautions, H&P , NPO status , Patient's Chart, lab work & pertinent test results, reviewed documented beta blocker date and time   Airway Mallampati: II TM Distance: >3 FB Neck ROM: full    Dental No notable dental hx.    Pulmonary neg pulmonary ROS, asthma ,  clear to auscultation  Pulmonary exam normal       Cardiovascular Exercise Tolerance: Good neg cardio ROS regular Normal    Neuro/Psych Negative Neurological ROS  Negative Psych ROS   GI/Hepatic negative GI ROS, Neg liver ROS,   Endo/Other  Negative Endocrine ROS  Renal/GU negative Renal ROS  Genitourinary negative   Musculoskeletal   Abdominal Normal abdominal exam  (+)   Peds  Hematology negative hematology ROS (+)   Anesthesia Other Findings   Reproductive/Obstetrics negative OB ROS                          Anesthesia Physical Anesthesia Plan  ASA: II  Anesthesia Plan: General   Post-op Pain Management:    Induction: Intravenous  Airway Management Planned: LMA  Additional Equipment:   Intra-op Plan:   Post-operative Plan:   Informed Consent: I have reviewed the patients History and Physical, chart, labs and discussed the procedure including the risks, benefits and alternatives for the proposed anesthesia with the patient or authorized representative who has indicated his/her understanding and acceptance.   Dental Advisory Given  Plan Discussed with: CRNA and Surgeon  Anesthesia Plan Comments:        Anesthesia Quick Evaluation

## 2011-08-29 NOTE — Progress Notes (Addendum)
Reviewed cystogram in Rad. On draining film there is a pocket of contrast at the right bladder neck where the pen tip was located. Therefore will leave foley for 7 days. Cystogram Nov 23 prior to foley D/C. Spoke to PACU nurse and ordered foley placement verbally and in Epic.

## 2011-08-30 MED ORDER — NICOTINE 21 MG/24HR TD PT24
21.0000 mg | MEDICATED_PATCH | Freq: Every day | TRANSDERMAL | Status: DC
  Administered 2011-08-30 – 2011-09-01 (×3): 21 mg via TRANSDERMAL
  Filled 2011-08-30 (×4): qty 1

## 2011-08-30 NOTE — Progress Notes (Signed)
Per Psychiatrist, Dr. Ferol Luz, Pt needing IOP/Day treatment and needing DBT tx increased from 1x per week to 2x per week.  CSW weekday to f/u.  Pt not needing IVC, at this time, as Pt willing to remain in [facility] until CSW can coordinate services on Monday.  Per psych MD, Pt not appropriate for [facility], at this time.  See CSW's brief note in Shadow Chart.  Providence Crosby, LCSWA Clinical Social Work [phone]

## 2011-08-30 NOTE — Progress Notes (Signed)
Per Terri at [facility], Pt not appropriate for their facility due to acuity.  Providence Crosby, LCSWA Clinical Social Work [phone]

## 2011-08-30 NOTE — Progress Notes (Signed)
T: 98 .5  BP: 10273  P: 61  R:16  Tolerates diet well.  No abdominal pain or nausea.  Foley draining clear urine.  Urinary output: 800 ml.  P: Keep Foley indwelling.

## 2011-08-30 NOTE — Progress Notes (Signed)
Subjective: Patient denies abdominal pain. She denies any other complaints.  Objective: Blood pressure 125/55, pulse 72, temperature 98.5 F (36.9 C), temperature source Oral, resp. rate 18, height 4\' 11"  (1.499 m), weight 102.2 kg (225 lb 5 oz), SpO2 98.00%.  Intake/Output Summary (Last 24 hours) at 08/30/11 1615 Last data filed at 08/30/11 1400  Gross per 24 hour  Intake   1920 ml  Output   1227 ml  Net    693 ml   General exam: Patient is sitting comfortably in bed. Respiratory system: Clear Cardiovascular system: First and second heart sounds heard, regular. Gastrointestinal system: Abdomen is nondistended, soft,and normal bowel sounds heard. Nontender Central nervous system: Patient is awake alert oriented and in no obvious distress. No focal deficits.  Lab Results: Basic Metabolic Panel:  Basename 08/28/11 0507  NA 138  K 3.8  CL 103  CO2 26  GLUCOSE 78  BUN 10  CREATININE 0.71  CALCIUM 8.9  MG --  PHOS --   Liver Function Tests: No results found for this basename: AST:2,ALT:2,ALKPHOS:2,BILITOT:2,PROT:2,ALBUMIN:2 in the last 72 hours No results found for this basename: LIPASE:2,AMYLASE:2 in the last 72 hours No results found for this basename: AMMONIA:2 in the last 72 hours CBC:  Basename 08/28/11 0507  WBC 9.3  NEUTROABS --  HGB 13.1  HCT 41.1  MCV 88.0  PLT 298   Cardiac Enzymes: No results found for this basename: CKTOTAL:3,CKMB:3,CKMBINDEX:3,TROPONINI:3 in the last 72 hours BNP: No results found for this basename: POCBNP:3 in the last 72 hours D-Dimer: No results found for this basename: DDIMER:2 in the last 72 hours CBG: No results found for this basename: GLUCAP:6 in the last 72 hours Hemoglobin A1C: No results found for this basename: HGBA1C in the last 72 hours Fasting Lipid Panel: No results found for this basename: CHOL,HDL,LDLCALC,TRIG,CHOLHDL,LDLDIRECT in the last 72 hours Thyroid Function Tests: No results found for this basename:  TSH,T4TOTAL,FREET4,T3FREE,THYROIDAB in the last 72 hours Anemia Panel: No results found for this basename: VITAMINB12,FOLATE,FERRITIN,TIBC,IRON,RETICCTPCT in the last 72 hours Coagulation: No results found for this basename: LABPROT:2,INR:2 in the last 72 hours Urine Drug Screen:  Alcohol Level: No results found for this basename: ETH:2 in the last 72 hours Urinalysis:  Misc. Labs:   Micro Results: Recent Results (from the past 240 hour(s))  URINE CULTURE     Status: Normal   Collection Time   08/26/11  9:13 PM      Component Value Range Status Comment   Specimen Description URINE, CLEAN CATCH   Final    Special Requests NONE   Final    Setup Time [id number]   Final    Colony Count >=100,000 COLONIES/ML   Final    Culture     Final    Value: Multiple bacterial morphotypes present, none predominant. Suggest appropriate recollection if clinically indicated.   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  History of tobacco smoking.  CHEST - 2 VIEW  Comparison: None.  Findings: The cardiac silhouette is normal size and shape. Mediastinal and hilar contours appear normal. The lungs are well aerated and free of infiltrates. No pleural abnormality is evident. There is slight scoliosis.  IMPRESSION: No acute or active cardiopulmonary or pleural abnormality is evident.  There is no evidence of tuberculosis.  Original Report Authenticated By: Crawford Givens, M.D.   Dg Abd 1 View  08/25/2011  *  RADIOLOGY REPORT*  Clinical Data: The patient reports placing an ink pen into her urinary tract.  ABDOMEN - 1 VIEW  Comparison: None.  Findings: Linear radiodense and radiolucent foreign body overlying the inferior pelvis, centered in the midline.  Normal bowel gas pattern.  Unremarkable bones.  IMPRESSION: Ink pen in the urinary bladder.  Original Report Authenticated By: Darrol Angel, M.D.    Medications: Scheduled Meds:    . citalopram  20 mg Oral Daily  . docusate sodium  100 mg Oral BID  . LORazepam  2 mg Oral QHS  . simvastatin  20 mg Oral QHS  . sodium chloride  3 mL Intravenous Q12H  . sulfamethoxazole-trimethoprim  1 tablet Oral Q12H  . tiotropium  18 mcg Inhalation Daily  . traZODone  25 mg Oral QHS  . ziprasidone  40 mg Oral BID WC   Continuous Infusions:    . lactated ringers 125 mL/hr (08/29/11 1315)  . lactated ringers 1,000 mL (08/29/11 1102)  . lactated ringers 999 mL/hr (08/29/11 1545)   PRN Meds:.acetaminophen, fentaNYL, HYDROcodone-acetaminophen, ondansetron, promethazine, senna  Assessment/Plan: Patient Active Hospital Problem List: 1. Foreign body GU tract (08/27/2011)/urinary bladder    Assessment: Status post cystoscopic removal of foreign body from the bladder yesterday.    Plan: Continue Foley catheter. Cystogram November 23 prior to discontinuing Foley catheter. Urology continues to follow. 2. Self injurious behavior, borderline personality trait and impulsiveness: Discussed her care extensively with Dr. Ferol Luz. Please see Dr. Ferol Luz, psychiatrist note for details. She recommends discharge of patient to assisted living facility on Monday after confirming DBT followup appointments, date treatment activity appointments. 3. Hyperlipidemia: Continue statins 4. History of asthma: Stable  Disposition: Discharge patient to assisted living facility on Monday, November 19.   Myrical Andujo 08/30/2011, 4:15 PM

## 2011-08-30 NOTE — Consult Note (Signed)
Cynthia Phelps is a 20 y.o.CF who has made multiple self injurious attempts in the past two weeks by insertion of objects into the urethra.  She is alert and oriented to person, place, date and situation.  She admits to inserting a pen into urethra for  'sexual pleasure'.  She says she doesn't know why; then says this time she was very upset and angry because her father said she could not visit on the weekend.  She has good eye contact, spontaneous, organized sequential speech. She is cognitively intact.  She denies auditory/visual hallucinations. She denies racing thoughts. She does not know why she began hurting herself.  She gueses that it was to release emotions she felt.  She says she used to cut arms and legs with razor ~ age [age] and then switched to insertion of objects into her urethra. She denies sexual abuse.  She says her first heterosexual encounter was age 43. She denies masturbation saying she did not know how but 'wants to buy a toy -stating she know where to buy one'. Significant medical history at age 46 was an accidental fall off a diving board.  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  She denies use of alcohol or drugs.  She smokes cigarettes, 2ppd. She says she wants to quit and would accept nicoderm patches.  She has breathing problems and uses [place].  She denies legal problems. She says she has a good relationship with father, strained relationship with mother. She has an older sister who works in [place]. Her parents divorced when she was [age].  She lived with her mother until age 63, then lived with father and stepmother.  She said they were very good to her.  "I love my daddy".  She had two step siblings.  She lived there until she began cutting.  Her father had her go to a group home because he said his home was not safe for her.  She denies suicidal ideation or prior attempts.  She was discharged from [facility] ~ 2 wks ago for the same  behavior.  She complained she went to assisted living and the day program she was to go to was closed; that she needed structured activity.  Today she is very coherent and cooperative in discussing her history and subjective thoughts. She is cognitively intact.  She agrees to staying in the hospital until coordination of discharge plans are made.  Her insight is limited; her judgment is impaired.  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  She says she has a Dr. Betti Cruz and a therapist [name?]  She takes Geodon, Ativan, Celexa and trazodone.  Her impulsivity overrides her judgment.  Recommendation:  Before discharge, attempt to coordinate outpatient care: 1. Confirm Dr. Reddy's plan to continue treatment  2. Confirm with therapist that DBT Dialectical Behavioral Therapy may be implemented 2X wk. 3. Confirm with [facility] may return - and supervised. 4 Identify and enroll pt before discharge to a day treatment activity - therapeutic preferred program. 5.  Pt requests smoking cessation plan:  Order Nicoderm patches 21 mg patch daily.

## 2011-08-31 NOTE — Progress Notes (Signed)
T: 97.9   BP: 125/55    P: 56    R: 18  Foley catheter "came out" yesterday with balloon inflated.  Catheter was replaced and is draining clear urine.  Urinary output: 1425 cc.  I advised patient to be careful with the catheter and not to pull on it.    Plan: leave catheter indwelling.

## 2011-08-31 NOTE — Progress Notes (Signed)
Subjective: Patient denies complaints and is eager to go home. No pain.  Objective: Blood pressure 126/61, pulse 86, temperature 98.7 F (37.1 C), temperature source Oral, resp. rate 20, height 4\' 11"  (1.499 m), weight 102.2 kg (225 lb 5 oz), SpO2 99.00%.  Intake/Output Summary (Last 24 hours) at 08/31/11 1753 Last data filed at 08/31/11 1300  Gross per 24 hour  Intake   1680 ml  Output   2152 ml  Net   -472 ml   General exam: Comfortable Respiratory system: Clear Cardiovascular system: First and second heart sounds heard, regular. Gastrointestinal system: Abdomen is nondistended, soft,and normal bowel sounds heard. Nontender Central nervous system: Patient is awake alert oriented and in no obvious distress. No focal deficits.  Lab Results: Basic Metabolic Panel: No results found for this basename: NA:2,K:2,CL:2,CO2:2,GLUCOSE:2,BUN:2,CREATININE:2,CALCIUM:2,MG:2,PHOS:2 in the last 72 hours Liver Function Tests: No results found for this basename: AST:2,ALT:2,ALKPHOS:2,BILITOT:2,PROT:2,ALBUMIN:2 in the last 72 hours No results found for this basename: LIPASE:2,AMYLASE:2 in the last 72 hours No results found for this basename: AMMONIA:2 in the last 72 hours CBC: No results found for this basename: WBC:2,NEUTROABS:2,HGB:2,HCT:2,MCV:2,PLT:2 in the last 72 hours Cardiac Enzymes: No results found for this basename: CKTOTAL:3,CKMB:3,CKMBINDEX:3,TROPONINI:3 in the last 72 hours BNP: No results found for this basename: POCBNP:3 in the last 72 hours D-Dimer: No results found for this basename: DDIMER:2 in the last 72 hours CBG: No results found for this basename: GLUCAP:6 in the last 72 hours Hemoglobin A1C: No results found for this basename: HGBA1C in the last 72 hours Fasting Lipid Panel: No results found for this basename: CHOL,HDL,LDLCALC,TRIG,CHOLHDL,LDLDIRECT in the last 72 hours Thyroid Function Tests: No results found for this basename: TSH,T4TOTAL,FREET4,T3FREE,THYROIDAB in  the last 72 hours Anemia Panel: No results found for this basename: VITAMINB12,FOLATE,FERRITIN,TIBC,IRON,RETICCTPCT in the last 72 hours Coagulation: No results found for this basename: LABPROT:2,INR:2 in the last 72 hours Urine Drug Screen:  Alcohol Level: No results found for this basename: ETH:2 in the last 72 hours Urinalysis:  Misc. Labs:   Micro Results: Recent Results (from the past 240 hour(s))  URINE CULTURE     Status: Normal   Collection Time   08/26/11  9:13 PM      Component Value Range Status Comment   Specimen Description URINE, CLEAN CATCH   Final    Special Requests NONE   Final    Setup Time [id number]   Final    Colony Count >=100,000 COLONIES/ML   Final    Culture     Final    Value: Multiple bacterial morphotypes present, none predominant. Suggest appropriate recollection if clinically indicated.   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  History of tobacco smoking.  CHEST - 2 VIEW  Comparison: None.  Findings: The cardiac silhouette is normal size and shape. Mediastinal and hilar contours appear normal. The lungs are well aerated and free of infiltrates. No pleural abnormality is evident. There is slight scoliosis.  IMPRESSION: No acute or active cardiopulmonary or pleural abnormality is evident.  There is no evidence of tuberculosis.  Original Report Authenticated By: Crawford Givens, M.D.   Dg Abd 1 View  08/25/2011  *RADIOLOGY REPORT*  Clinical Data: The patient reports placing an ink pen into her urinary tract.  ABDOMEN - 1 VIEW  Comparison: None.  Findings: Linear radiodense and radiolucent foreign body overlying the inferior pelvis, centered in the  midline.  Normal bowel gas pattern.  Unremarkable bones.  IMPRESSION: Ink pen in the urinary bladder.  Original Report Authenticated By: Darrol Angel, M.D.    Medications: Scheduled  Meds:    . citalopram  20 mg Oral Daily  . docusate sodium  100 mg Oral BID  . LORazepam  2 mg Oral QHS  . nicotine  21 mg Transdermal Daily  . simvastatin  20 mg Oral QHS  . sodium chloride  3 mL Intravenous Q12H  . sulfamethoxazole-trimethoprim  1 tablet Oral Q12H  . tiotropium  18 mcg Inhalation Daily  . traZODone  25 mg Oral QHS  . ziprasidone  40 mg Oral BID WC   Continuous Infusions:    . lactated ringers 125 mL/hr (08/29/11 1315)  . lactated ringers 999 mL/hr (08/29/11 1545)   PRN Meds:.acetaminophen, fentaNYL, HYDROcodone-acetaminophen, ondansetron, promethazine, senna  Assessment/Plan: Patient Active Hospital Problem List: 1. Foreign body GU tract (08/27/2011)/urinary bladder    Assessment: Status post cystoscopic removal of foreign body from the bladder on 08/29/2011.    Plan: Continue Foley catheter. Cystogram November 23 prior to discontinuing Foley catheter. Urology continues to follow. Patient has now been changed to oral Bactrim. 2. Self injurious behavior, borderline personality trait and impulsiveness: Discussed her care extensively with Dr. Ferol Luz yesterday. Please see Dr. Ferol Luz, psychiatrist note for details. She recommends discharge of patient to assisted living facility on Monday after confirming DBT followup appointments, date treatment activity appointments. 3. Hyperlipidemia: Continue statins 4. History of asthma: Stable  Disposition: Discharge patient to assisted living facility on Monday, November 19 after her above psychiatric care arrangements are confirmed by the Child psychotherapist.   Cynthia Phelps 08/31/2011, 5:53 PM

## 2011-09-01 DIAGNOSIS — F39 Unspecified mood [affective] disorder: Secondary | ICD-10-CM

## 2011-09-01 MED ORDER — SULFAMETHOXAZOLE-TMP DS 800-160 MG PO TABS: 1.0000 | ORAL_TABLET | Freq: Every day | ORAL | Status: AC

## 2011-09-01 MED ORDER — TETANUS-DIPHTHERIA TOXOIDS TD 5-2 LFU IM INJ
0.5000 mL | INJECTION | Freq: Once | INTRAMUSCULAR | Status: AC
  Administered 2011-09-01: 0.5 mL via INTRAMUSCULAR
  Filled 2011-09-01 (×2): qty 0.5

## 2011-09-01 MED ORDER — MEDROXYPROGESTERONE ACETATE 150 MG/ML IM SUSP: 150.0000 mg | INTRAMUSCULAR | Status: AC

## 2011-09-01 MED ORDER — SULFAMETHOXAZOLE-TMP DS 800-160 MG PO TABS
1.0000 | ORAL_TABLET | Freq: Every day | ORAL | Status: DC
Start: 2011-09-01 — End: 2011-09-01
  Administered 2011-09-01: 1 via ORAL
  Filled 2011-09-01 (×2): qty 1

## 2011-09-01 MED ORDER — TUBERCULIN PPD 5 UNIT/0.1ML ID SOLN
5.0000 [IU] | Freq: Once | INTRADERMAL | Status: AC
  Administered 2011-09-01: 5 [IU] via INTRADERMAL
  Filled 2011-09-01 (×2): qty 0.1

## 2011-09-01 NOTE — Progress Notes (Addendum)
  Cynthia Phelps is a 20 y.o. female [id number] 1991-07-20  Subjective/Objective:  I I saw the patient and reviewed the consult done by Dr. Ferol Luz. Patient is currently logical and goal-directed normal mood or psychotic symptoms present at this time. She denies suicidal or homicidal ideations. She lives in assisted living facility and is motivated to followup in the DBT treatment in the outpatient setting. Psychoeducation given to the patient. Her father has contacted a Child psychotherapist and does not want patient return to the assisted living facility and wants some other alternative placement. At this time I will agree with Dr. Ferol Luz recommendations that patient can be followed in the outpatient setting. Patient is cleared from psychiatry for discharge. Social worker can work on Acupuncturist. I will followup as needed.  Filed Vitals:   09/01/11 0500  BP: 122/55  Pulse: 68  Temp: 98.5 F (36.9 C)  Resp: 20    Lab Results:   BMET    Component Value Date/Time   NA 138 08/28/2011 0507   K 3.8 08/28/2011 0507   CL 103 08/28/2011 0507   CO2 26 08/28/2011 0507   GLUCOSE 78 08/28/2011 0507   BUN 10 08/28/2011 0507   CREATININE 0.71 08/28/2011 0507   CALCIUM 8.9 08/28/2011 0507   GFRNONAA >90 08/28/2011 0507   GFRAA >90 08/28/2011 0507    Medications:  Scheduled:     . citalopram  20 mg Oral Daily  . docusate sodium  100 mg Oral BID  . LORazepam  2 mg Oral QHS  . nicotine  21 mg Transdermal Daily  . simvastatin  20 mg Oral QHS  . sodium chloride  3 mL Intravenous Q12H  . sulfamethoxazole-trimethoprim  1 tablet Oral Daily  . tiotropium  18 mcg Inhalation Daily  . traZODone  25 mg Oral QHS  . ziprasidone  40 mg Oral BID WC  . DISCONTD: sulfamethoxazole-trimethoprim  1 tablet Oral Q12H     PRN Meds acetaminophen, fentaNYL, HYDROcodone-acetaminophen, ondansetron, promethazine, senna   Hilma Steinhilber S MD 09/01/2011

## 2011-09-01 NOTE — Discharge Summary (Signed)
DISCHARGE SUMMARY  Cynthia Phelps  MR#: [id number]  DOB:September 24, 1991  Date of Admission: 08/26/2011 Date of Discharge: 09/01/2011  Attending Physician:Forest Pruden  Patient's BJY:NWGNFAOZ, Cynthia Civatte, MD  Consults:  1. Urology: Antony Haste, MD 2. Psychiatric: Mickeal Skinner, MD  Discharge Diagnoses: Present on Admission:  .Foreign body GU tract: Removed by cystoscopy.    Current Discharge Medication List    START taking these medications   Details  sulfamethoxazole-trimethoprim (BACTRIM DS) 800-160 MG per tablet Take 1 tablet by mouth daily. Qty: 9 tablet, Refills: 0      CONTINUE these medications which have CHANGED   Details  medroxyPROGESTERone (DEPO-PROVERA) 150 MG/ML injection Inject 1 mL (150 mg total) into the muscle every 3 (three) months.      CONTINUE these medications which have NOT CHANGED   Details  citalopram (CELEXA) 20 MG tablet Take 1 tablet (20 mg total) by mouth daily. Qty: 30 tablet, Refills: 0    docusate sodium (COLACE) 100 MG capsule Take 100 mg by mouth 2 (two) times daily.      LORazepam (ATIVAN) 2 MG tablet Take 2 mg by mouth at bedtime.      simvastatin (ZOCOR) 20 MG tablet Take 20 mg by mouth daily.      tiotropium (SPIRIVA) 18 MCG inhalation capsule Place 18 mcg into inhaler and inhale daily.      traZODone (DESYREL) 50 MG tablet Take 0.5 tablets (25 mg total) by mouth at bedtime. Qty: 15 tablet, Refills: 0    ziprasidone (GEODON) 40 MG capsule Take 1 capsule (40 mg total) by mouth 2 (two) times daily with a meal. Qty: 60 capsule, Refills: 0      STOP taking these medications     aspirin 81 MG tablet      cephALEXin (KEFLEX) 500 MG capsule           Hospital Course: Patient is a 20 year old female with history of asthma, hyperlipidemia, borderline personality trait, multiple prior episodes of inserting foreign bodies into her vagina and bladder who now inserted an open ink pen into her bladder. She was thereby sent  to the emergency room with complaints of abdominal pain. Patient was seen by a urologist in the emergency department. They recommended admitting her to the internal medicine service so that a psychiatric consultation could be achieved given her history of repeated self injurious behavior. She was started empirically on IV ciprofloxacin. 2 days into the admission the urologist performed a cystoscopy and removed the foreign body. They placed a Foley catheter and recommended that he stay on toe they have had a chance to perform a cystogram and remove it either on November 26 or 27th. They've changed her antibiotic to oral Bactrim. Psychiatry kindly saw the patient and recommended Dialectical Behavioral Therapy, and day treatment activity and followup with her primary psychiatrist. The social worker has made arrangements for the same. The patient's father was reluctant for her to return to the group home but finally Child psychotherapist discussed with him and it's agreed that patient will return to the group home. Patient has been repeatedly counseled not to insert foreign bodies into her genital tract which could lead to dangerous consequences. She verbalizes understanding. At the request of the group home patient is being provided with tetanus toxoid shot as well as tuberculin testing. The PPD will have to be read in 48-72 hours from today.    Day of Discharge BP 124/85  Pulse 85  Temp(Src) 98.9 F (37.2 C) (Oral)  Resp 18  Ht 4\' 11"  (1.499 m)  Wt 102.2 kg (225 lb 5 oz)  BMI 45.51 kg/m2  SpO2 98%  Physical Exam:  general exam: Patient is ambulating comfortably in the halls. Respiratory system: Clear Cardiovascular system: S1-S2 heard, regular Gastrointestinal system: Abdomen nondistended, soft and normal bowel sounds heard. Central nervous system: Alert and oriented with no focal neurological deficits.  Laboratory Data: 1.  CBC and BMP on November 15 were unremarkable. 2. Cystogram done in the OR on  November 16:Impression: No evidence of bladder or distal right ureteral  perforation by the foreign body place in the bladder. 3. Abdominal x-ray: Ink pen in the urinary bladder.   Disposition: patient is discharged to the group home in stable condition  Follow-up Appts: Discharge Orders    Future Orders Please Complete By Expires   Diet - low sodium heart healthy      Increase activity slowly      Discharge instructions      Comments:   Keep Foley's catheter until seen by Urologist.   No wound care      Call MD for:  temperature >100.4      Call MD for:  severe uncontrolled pain         Follow-up: 1. With Dr. Esau Grew, urology: Make an appointment to be seen on November 26 or 27th for cystogram and removal of the Foley catheter 2. With Dr. Betti Cruz, psychiatry: Make appointment   Tests Needing Follow-up:  cystogram  Signed: Aivy Akter 09/01/2011, 4:50 PM

## 2011-09-01 NOTE — Progress Notes (Signed)
Received a call from [place] with CSW, patient will need [facility] RN visit at group home after d/c. Group Home uses [facility] for [facility] needs. Contacted Kristen with [facility] to arrange. Patient for d/c today.

## 2011-09-01 NOTE — Progress Notes (Signed)
Patient's father contacted this Clinical research associate advising he has not heard anything regarding patient's care and he does not want her to return to the ALF she has come from. Patient's father expressed he does not feel that is a good fit for her and wants to pursue an alternative placement.  Advised father I will contact CSW and have that individual contact him back. Ileene Hutchinson , MSW, LCSWA 09/01/2011 9:42 AM

## 2011-09-01 NOTE — Progress Notes (Signed)
3 Days Post-Op Subjective: Patient without complaints.   Objective: Vital signs in last 24 hours: Temp:  [98.5 F (36.9 C)-98.7 F (37.1 C)] 98.5 F (36.9 C) (11/19 0500) Pulse Rate:  [68-86] 68  (11/19 0500) Resp:  [20] 20  (11/19 0500) BP: (122-128)/(55-62) 122/55 mmHg (11/19 0500) SpO2:  [96 %-99 %] 98 % (11/19 0500)  Intake/Output from previous day: 11/18 0701 - 11/19 0700 In: 1080 [P.O.:1080] Out: 3023 [Urine:3020; Stool:3] Intake/Output this shift:    Physical Exam:  NAD, sitting on bed eating breakfast. Foley in place. Urine clear.   Assessment/Plan: -s/p cystoscopy with foreign body/pen removal. Cystotomy found at time of pen removal. --if patient D/C'd: send home with foley to gravity, foley/leg bag teaching. Also, overnight bag for connection when sleeping. -send home with one Bactrim DS per day (one po daily) for 10 days -I will see back Nov 26 or Nov 27 for cystogram in the office.    LOS: 6 days   Antony Haste 09/01/2011, 8:25 AM

## 2011-09-01 NOTE — Progress Notes (Signed)
CSW spoke with patient's father, Kynli Chou [phone] re: discharge planning. Father agreed to have patient return to [facility]. CSW confirmed with Shirlean Day @ ALF that they are willing to take patient back - they are requesting that we give patient TB skin test and tetanus shot before she leaves. Royetta Crochet will pick patient up when ready to be discharged - please call @ [phone] or [phone]. CSW confirmed with Victorino Dike, patient's case manager @ [address] [phone], that patient has a 11am appointment for assessment with [facility] @ [address] in [place]. Patient's therapist is Lavonia Drafts @ [address] in [place]. Facility requesting order for home health RN incase patient pulls out her foley catheter, MD & RNCM aware.   Unice Bailey, LCSWA [phone]

## 2011-09-02 ENCOUNTER — Encounter (HOSPITAL_COMMUNITY): Payer: Self-pay | Admitting: Urology

## 2011-09-08 ENCOUNTER — Emergency Department (HOSPITAL_COMMUNITY)
Admission: EM | Admit: 2011-09-08 | Discharge: 2011-09-08 | Disposition: A | Discharge status: Home / Self Care | Payer: Medicaid Other | Attending: Emergency Medicine | Admitting: Emergency Medicine

## 2011-09-08 ENCOUNTER — Encounter (HOSPITAL_COMMUNITY): Payer: Self-pay | Admitting: *Deleted

## 2011-09-08 DIAGNOSIS — Z331 Pregnant state, incidental: Secondary | ICD-10-CM | POA: Insufficient documentation

## 2011-09-08 DIAGNOSIS — Z008 Encounter for other general examination: Secondary | ICD-10-CM | POA: Insufficient documentation

## 2011-09-08 NOTE — ED Notes (Signed)
Pt states that she feels like her medications need adjusting. States that she is having mood swings and has not been sleeping. Denies suicidal or homicidal thoughts.

## 2011-09-08 NOTE — ED Provider Notes (Signed)
History     CSN: [id number] Arrival date & time: 09/08/2011  7:12 PM   First MD Initiated Contact with Patient 09/08/11 1919      Chief Complaint  Patient presents with  . Medication Dose Change    (Consider location/radiation/quality/duration/timing/severity/associated sxs/prior treatment) HPI ... patient thinks she is pregnant and is concerned about her medications. Last menstrual period was in September 2012. She is currently taking Lexapro, Valium, hydrocodone, tramadol.  She has no vaginal bleeding or abdominal pain or dysuria. She has not been to see her mental health Dr. neither has she seen her OB/GYN doctor. nothing makes symptoms better or worse. She is not homicidal or suicidal Past Medical History  Diagnosis Date  . Asthma   . Hyperlipidemia, acquired   . Borderline personality disorder   . Depression   . Anxiety     Past Surgical History  Procedure Date  . Open cystotomy   . No past surgeries   . Foreign body removal 08/29/2011    Procedure: FOREIGN BODY REMOVAL ADULT;  Surgeon: Antony Haste, MD;  Location: [facility];  Service: Urology;  Laterality: N/A;  . Cystoscopy 08/29/2011    Procedure: CYSTOSCOPY;  Surgeon: Antony Haste, MD;  Location: [facility];  Service: Urology;  Laterality: N/A;  with right retrograde and cystogram    History reviewed. No pertinent family history.  History  Substance Use Topics  . Smoking status: Current Everyday Smoker -- 2.0 packs/day    Types: Cigarettes  . Smokeless tobacco: Never Used   Comment: unknown at this time  . Alcohol Use: No    OB History    Grav Para Term Preterm Abortions TAB SAB Ect Mult Living                  Review of Systems  All other systems reviewed and are negative.    Allergies  Haldol; Latex; Doxycycline; and Tetracyclines & related  Home Medications   Current Outpatient Rx  Name Route Sig Dispense Refill  . CITALOPRAM HYDROBROMIDE 20 MG PO TABS Oral Take 1 tablet (20  mg total) by mouth daily. 30 tablet 0  . DOCUSATE SODIUM 100 MG PO CAPS Oral Take 100 mg by mouth 2 (two) times daily.      Marland Kitchen LORAZEPAM 2 MG PO TABS Oral Take 2 mg by mouth at bedtime.      Marland Kitchen MEDROXYPROGESTERONE ACETATE 150 MG/ML IM SUSP Intramuscular Inject 1 mL (150 mg total) into the muscle every 3 (three) months.      Previous home medication.  Marland Kitchen SIMVASTATIN 20 MG PO TABS Oral Take 20 mg by mouth daily.      . SULFAMETHOXAZOLE-TMP DS 800-160 MG PO TABS Oral Take 1 tablet by mouth daily. 9 tablet 0  . TIOTROPIUM BROMIDE MONOHYDRATE 18 MCG IN CAPS Inhalation Place 18 mcg into inhaler and inhale daily.      . TRAZODONE HCL 50 MG PO TABS Oral Take 0.5 tablets (25 mg total) by mouth at bedtime. 15 tablet 0  . ZIPRASIDONE HCL 40 MG PO CAPS Oral Take 1 capsule (40 mg total) by mouth 2 (two) times daily with a meal. 60 capsule 0    BP 130/80  Pulse 81  Temp(Src) 98.6 F (37 C) (Oral)  Resp 18  SpO2 100%  Physical Exam  Nursing note and vitals reviewed. Constitutional: She is oriented to person, place, and time. She appears well-developed and well-nourished.  HENT:  Head: Normocephalic and atraumatic.  Eyes: Conjunctivae  and EOM are normal. Pupils are equal, round, and reactive to light.  Neck: Normal range of motion. Neck supple.  Cardiovascular: Normal rate and regular rhythm.   Pulmonary/Chest: Effort normal and breath sounds normal.  Abdominal: Soft. Bowel sounds are normal.  Musculoskeletal: Normal range of motion.  Neurological: She is alert and oriented to person, place, and time.  Skin: Skin is warm and dry.  Psychiatric: She has a normal mood and affect.    ED Course  Procedures (including critical care time)  Labs Reviewed - No data to display No results found.   1. Pregnancy as incidental finding       MDM  Patient is stable. Normal mood. Benign exam. Will followup with her doctor for further OB/GYN care        Donnetta Hutching, MD 09/08/11 2150

## 2011-09-08 NOTE — ED Notes (Signed)
Pt in with group home supervisor who reports that pt had pulled her foley catheter out several days ago on her own and was supposed to leave it in until next week.  Pt states "it was aggravating"  And didn't want it in anymore.   Pt relates also that she feels her medications are not working but has not been on them routinely.   Per this nurse and group home supervisor, informed pt that she needed to take them as directed and/or follow up with the prescriber of the meds.   Pt denies SI or HI, and denies need for evaluation after pulling foley cath out.   Pt denies problems with urination and states she will follow up with the md that inserted the catheter, states is urinating fine without problems from same.

## 2011-09-10 ENCOUNTER — Encounter (HOSPITAL_COMMUNITY): Payer: Self-pay | Admitting: *Deleted

## 2011-09-10 ENCOUNTER — Emergency Department (HOSPITAL_COMMUNITY)
Admission: EM | Admit: 2011-09-10 | Discharge: 2011-09-11 | Disposition: A | Discharge status: Other Acute Inpatient Hospital | Payer: Medicaid Other | Attending: Emergency Medicine | Admitting: Emergency Medicine

## 2011-09-10 DIAGNOSIS — F489 Nonpsychotic mental disorder, unspecified: Secondary | ICD-10-CM | POA: Insufficient documentation

## 2011-09-10 DIAGNOSIS — Z7289 Other problems related to lifestyle: Secondary | ICD-10-CM

## 2011-09-10 DIAGNOSIS — S61509A Unspecified open wound of unspecified wrist, initial encounter: Secondary | ICD-10-CM | POA: Insufficient documentation

## 2011-09-10 DIAGNOSIS — F411 Generalized anxiety disorder: Secondary | ICD-10-CM | POA: Insufficient documentation

## 2011-09-10 DIAGNOSIS — F3289 Other specified depressive episodes: Secondary | ICD-10-CM | POA: Insufficient documentation

## 2011-09-10 DIAGNOSIS — F17200 Nicotine dependence, unspecified, uncomplicated: Secondary | ICD-10-CM | POA: Insufficient documentation

## 2011-09-10 DIAGNOSIS — X789XXA Intentional self-harm by unspecified sharp object, initial encounter: Secondary | ICD-10-CM | POA: Insufficient documentation

## 2011-09-10 DIAGNOSIS — F329 Major depressive disorder, single episode, unspecified: Secondary | ICD-10-CM

## 2011-09-10 DIAGNOSIS — R609 Edema, unspecified: Secondary | ICD-10-CM | POA: Insufficient documentation

## 2011-09-10 DIAGNOSIS — E785 Hyperlipidemia, unspecified: Secondary | ICD-10-CM | POA: Insufficient documentation

## 2011-09-10 DIAGNOSIS — J45909 Unspecified asthma, uncomplicated: Secondary | ICD-10-CM | POA: Insufficient documentation

## 2011-09-10 DIAGNOSIS — F603 Borderline personality disorder: Secondary | ICD-10-CM | POA: Insufficient documentation

## 2011-09-10 HISTORY — DX: Unspecified mood (affective) disorder: F39

## 2011-09-10 LAB — URINALYSIS, ROUTINE W REFLEX MICROSCOPIC
Bilirubin Urine: NEGATIVE
Hgb urine dipstick: NEGATIVE
Nitrite: NEGATIVE
Protein, ur: NEGATIVE mg/dL
Specific Gravity, Urine: >1.03 — ABNORMAL HIGH (ref 1.005–1.030)
Urobilinogen, UA: 0.2 mg/dL (ref 0.0–1.0)

## 2011-09-10 LAB — RAPID URINE DRUG SCREEN, HOSP PERFORMED
Barbiturates: NOT DETECTED
Cocaine: NOT DETECTED
Tetrahydrocannabinol: NOT DETECTED

## 2011-09-10 NOTE — BH Assessment (Signed)
Assessment Note   Cynthia Phelps is an 20 y.o. female.  PT REPORTS LEAVING HER GROUP HOME TODAY WITH OUT PERMISSION, POLICE WERE CALLED AND PICKED HER UP AT A [facility].  SHE REPORTS SHE GOT UPSET BECAUSE SOMEONE WAS PICKING ON HER AT THE GROUP HOME. 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 SHE REPORTS NOT LIKING WHERE SHE LIVES BUT AGREES TO TRY AND ADJUST. SHE REPORTS BEING COMPLIANT WITH HER MEDICATIONS AND DR REDDY JUST INCREASED HER TRANZODONE AND GEODONE WHICH IS TO START TODAY.  PT AGREES TO CONTRACT FOR SAFETY AND FOLLOW UP WITH [facility].  SPOKE WITH THE OWNER OF HOME AWAY FROM HOME GROUP HOME, Cynthia Phelps [phone], SHE AGREES TO PT'S RETURN TO THE GROUP HOME. DR Colon Branch AGREES WITH THIS DISPOSITION.  PT WILL BE TRANSPORTED TO GROUP HOME BY LAW ENFORCEMENT.       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 Asthma   . Hyperlipidemia, acquired   . Borderline personality disorder   . Anxiety   . Mood disorder   . Depression    Axis IV: housing problems Axis V: 51-60 moderate symptoms  Past Medical History:  Past Medical History  Diagnosis Date  . Asthma   . Hyperlipidemia, acquired   . Borderline personality disorder   . Anxiety   . Mood disorder   . Depression     Past Surgical History  Procedure Date  . Open cystotomy   . Foreign body removal 08/29/2011    Procedure: FOREIGN BODY REMOVAL ADULT;  Surgeon: Antony Haste, MD;  Location: [facility];  Service: Urology;  Laterality: N/A;  . Cystoscopy 08/29/2011    Procedure: CYSTOSCOPY;  Surgeon: Antony Haste, MD;  Location: [facility];  Service: Urology;  Laterality: N/A;  with right retrograde and cystogram  . Bladder surgery     Family History: History reviewed. No pertinent family history.  Social History:  reports that she has been smoking  Cigarettes.  She has been smoking about 1 pack per day. She has never used smokeless tobacco. She reports that she does not drink alcohol or use illicit drugs.  Allergies:  Allergies  Allergen Reactions  . Haldol (Haloperidol Decanoate) Shortness Of Breath and Other (See Comments)    Freezes up and tongue sticks out ,Unable to move arms and legs, muscle stiffness   . Latex     Sensitivity, not severe  . Doxycycline Itching, Rash and Other (See Comments)    Unable to move arms and legs, muscle stiffness  . Tetracyclines & Related Itching, Rash and Other (See Comments)    Unable to move arms and legs, muscle stiffness    Home Medications:  No current facility-administered medications on file as of 09/10/2011.   Medications Prior to Admission  Medication Sig Dispense Refill  . citalopram (CELEXA) 20 MG tablet Take 1 tablet (20 mg total) by mouth daily.  30 tablet  0  . tiotropium (SPIRIVA) 18 MCG inhalation capsule Place 18 mcg into inhaler and inhale daily.        Marland Kitchen docusate sodium (COLACE) 100 MG capsule Take 100 mg by mouth 2 (two) times daily.        . medroxyPROGESTERone (DEPO-PROVERA) 150 MG/ML injection Inject 1 mL (150 mg total) into the muscle every 3 (three) months.      Marland Kitchen  simvastatin (ZOCOR) 20 MG tablet Take 20 mg by mouth daily.        Marland Kitchen sulfamethoxazole-trimethoprim (BACTRIM DS) 800-160 MG per tablet Take 1 tablet by mouth daily.  9 tablet  0    OB/GYN Status:  No LMP recorded. Patient has had an injection.  General Assessment Data Assessment Number: 1  Living Arrangements: Group Home Can pt return to current living arrangement?: Yes Admission Status: Involuntary Is patient capable of signing voluntary admission?: No Transfer from: Group Home Referral Source: Other [facility] [phone])  Risk to self Suicidal Ideation: Yes-Currently Present Suicidal Intent: Yes-Currently Present Is patient at risk for suicide?: Yes Suicidal Plan?: Yes-Currently  Present Specify Current Suicidal Plan: CUT WRIST Access to Means: Yes Specify Access to Suicidal Means: SHARPS What has been your use of drugs/alcohol within the last 12 months?: NA Other Self Harm Risks: MADE SUPERFICIAL CUT TO LEFT WRIST Triggers for Past Attempts: Other (Comment) (LIVING QUARTERS) Intentional Self Injurious Behavior: Cutting Comment - Self Injurious Behavior: YES Factors that decrease suicide risk: Positive therapeutic relationships Family Suicide History: No Recent stressful life event(s): Recent negative physical changes (08/24/11 MOVED TO NEW GROUP HOME) Persecutory voices/beliefs?: No Depression: Yes Depression Symptoms: Feeling worthless/self pity;Feeling angry/irritable;Loss of interest in usual pleasures;Isolating Substance abuse history and/or treatment for substance abuse?: No Suicide prevention information given to non-admitted patients: Not applicable  Risk to Others Homicidal Ideation: No Thoughts of Harm to Others: No Current Homicidal Intent: No Current Homicidal Plan: No Access to Homicidal Means: No Identified Victim: NA History of harm to others?: No Assessment of Violence: None Noted Violent Behavior Description: NA Does patient have access to weapons?: No Criminal Charges Pending?: No Does patient have a court date: No  Mental Status Report Appear/Hygiene: Improved Eye Contact: Good Motor Activity: Freedom of movement Speech: Logical/coherent Level of Consciousness: Alert Mood: Depressed;Worthless, low self-esteem Affect: Depressed;Sad Anxiety Level: Minimal Thought Processes: Coherent;Relevant Judgement: Impaired Orientation: Person;Place;Time;Situation Obsessive Compulsive Thoughts/Behaviors: None  Cognitive Functioning Concentration: Normal Memory: Recent Intact;Remote Intact IQ: Average Insight: Poor Impulse Control: Poor Appetite: Good Sleep: No Change Total Hours of Sleep: 8  Vegetative Symptoms: None  Prior  Inpatient/Outpatient Therapy Prior Therapy: Inpatient Prior Therapy Dates: UNK Prior Therapy Facilty/Provider(s): [facility] Reason for Treatment: SELF-MULITATION            Values / Beliefs Cultural Requests During Hospitalization: None Spiritual Requests During Hospitalization: None        Additional Information 1:1 In Past 12 Months?: No CIRT Risk: No Elopement Risk: No Does patient have medical clearance?: Yes  Child/Adolescent Assessment Running Away Risk: Denies  Disposition:   [facility]    Disposition Disposition of Patient: Inpatient treatment program Type of inpatient treatment program: Adult  On Site Evaluation by:   Reviewed with Physician:     Hattie Perch Winford 09/10/2011 11:08 PM

## 2011-09-10 NOTE — ED Notes (Signed)
Patient is quiet and resting at this time.

## 2011-09-10 NOTE — ED Notes (Signed)
Gave patient drink, sandwich, and 2 warm blankets as requested.

## 2011-09-10 NOTE — ED Notes (Signed)
Pt states she started having suicidal thoughts this afternoon. Pt states she cut her wrist. Small lac to the left wrist. No bleeding, states she has cut self before. Was seen in the ed earlier this week for the same.

## 2011-09-10 NOTE — ED Provider Notes (Addendum)
History    This chart was scribed for [employer]. Colon Branch, MD, MD by Smitty Pluck. The patient was seen in room APA15 and the patient's care was started at 8:18PM.   CSN: [id number] Arrival date & time: 09/10/2011  7:56 PM   First MD Initiated Contact with Patient 09/10/11 1958      Chief Complaint  Patient presents with  . Suicidal    (Consider location/radiation/quality/duration/timing/severity/associated sxs/prior treatment) The history is provided by the patient and the police.   Cynthia Phelps is a 20 y.o. female who presents to the Emergency Department with suicidal thoughts and was BIB sheriff. 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 She advised that she had cut it originally two days ago and glued it back together. Today she took the glue off. Pt lives in group home and ran away because she does not like the home. Pt has a history of inserting objects into bladder through urethra with last episode occurring 3 weeks ago when she inserted an ink pen into her bladder. Patient is not in pain at this time.  Reports having previous admission to [facility] for SI for a length of 2 months and states her treatment while at [facility] was helpful.  Past Medical History  Diagnosis Date  . Asthma   . Hyperlipidemia, acquired   . Borderline personality disorder   . Anxiety   . Mood disorder   . Depression     Past Surgical History  Procedure Date  . Open cystotomy   . Foreign body removal 08/29/2011    Procedure: FOREIGN BODY REMOVAL ADULT;  Surgeon: Antony Haste, MD;  Location: [facility];  Service: Urology;  Laterality: N/A;  . Cystoscopy 08/29/2011    Procedure: CYSTOSCOPY;  Surgeon: Antony Haste, MD;  Location: [facility];  Service: Urology;  Laterality: N/A;  with right retrograde and cystogram  . Bladder surgery     History reviewed. No pertinent family history.  History  Substance Use Topics  . Smoking status: Current Everyday  Smoker -- 1.0 packs/day    Types: Cigarettes  . Smokeless tobacco: Never Used   Comment: unknown at this time  . Alcohol Use: No    OB History    Grav Para Term Preterm Abortions TAB SAB Ect Mult Living                  Review of Systems  All other systems reviewed and are negative.   10 Systems reviewed and are negative for acute change except as noted in the HPI.  Allergies  Haldol; Latex; Doxycycline; and Tetracyclines & related  Home Medications   Current Outpatient Rx  Name Route Sig Dispense Refill  . CITALOPRAM HYDROBROMIDE 20 MG PO TABS Oral Take 1 tablet (20 mg total) by mouth daily. 30 tablet 0  . DOCUSATE SODIUM 100 MG PO CAPS Oral Take 100 mg by mouth 2 (two) times daily.      Marland Kitchen LORAZEPAM 2 MG PO TABS Oral Take 2 mg by mouth at bedtime.      Marland Kitchen MEDROXYPROGESTERONE ACETATE 150 MG/ML IM SUSP Intramuscular Inject 1 mL (150 mg total) into the muscle every 3 (three) months.      Previous home medication.  Marland Kitchen SIMVASTATIN 20 MG PO TABS Oral Take 20 mg by mouth daily.      . SULFAMETHOXAZOLE-TMP DS 800-160 MG PO TABS Oral Take 1 tablet by mouth daily. 9 tablet 0  . TIOTROPIUM  BROMIDE MONOHYDRATE 18 MCG IN CAPS Inhalation Place 18 mcg into inhaler and inhale daily.      . TRAZODONE HCL 50 MG PO TABS Oral Take 0.5 tablets (25 mg total) by mouth at bedtime. 15 tablet 0  . ZIPRASIDONE HCL 40 MG PO CAPS Oral Take 1 capsule (40 mg total) by mouth 2 (two) times daily with a meal. 60 capsule 0    BP 113/60  Pulse 55  Temp(Src) 98.8 F (37.1 C) (Oral)  Resp 20  Ht 4\' 11"  (1.499 m)  Wt 220 lb (99.791 kg)  BMI 44.43 kg/m2  SpO2 100%  Physical Exam  Nursing note and vitals reviewed. Constitutional: She is oriented to person, place, and time. She appears well-developed and well-nourished. No distress.  HENT:  Head: Normocephalic and atraumatic.  Mouth/Throat: Oropharynx is clear and moist.  Eyes: EOM are normal. Pupils are equal, round, and reactive to light.  Neck: Neck  supple. No tracheal deviation present. No thyromegaly present.  Cardiovascular: Normal rate, regular rhythm and normal heart sounds.   Pulmonary/Chest: Effort normal and breath sounds normal. No respiratory distress. She has no wheezes.  Abdominal: Soft. There is no tenderness.  Musculoskeletal: Normal range of motion. She exhibits edema (baseline, 1+ bilateral lower extremities).           Neurological: She is alert and oriented to person, place, and time.  Skin: Skin is warm and dry.       Left wrist 2 cm superficial laceration with surrounding erythema. Several small papules on antecubital space on both arms with small pustules noted as well. Bilateral multiple excoriations on legs  Psychiatric:       Flat affect  Recurrent episodes of inserting foreign objects into the urethra into the bladder. Last occurrence was 3 weeks ago    ED Course  Procedures (including critical care time) DIAGNOSTIC STUDIES: Oxygen Saturation is 100% on room air, normal by my interpretation.    COORDINATION OF CARE: 9:21PM- Consult complete with Ella-ACT team counselor. Patient case explained and discussed.  Results for orders placed during the hospital encounter of 09/10/11  URINALYSIS, ROUTINE W REFLEX MICROSCOPIC      Component Value Range   Color, Urine YELLOW  YELLOW    APPearance CLEAR  CLEAR    Specific Gravity, Urine >1.030 (*) 1.005 - 1.030    pH 6.0  5.0 - 8.0    Glucose, UA NEGATIVE  NEGATIVE (mg/dL)   Hgb urine dipstick NEGATIVE  NEGATIVE    Bilirubin Urine NEGATIVE  NEGATIVE    Ketones, ur TRACE (*) NEGATIVE (mg/dL)   Protein, ur NEGATIVE  NEGATIVE (mg/dL)   Urobilinogen, UA 0.2  0.0 - 1.0 (mg/dL)   Nitrite NEGATIVE  NEGATIVE    Leukocytes, UA NEGATIVE  NEGATIVE   URINE RAPID DRUG SCREEN (HOSP PERFORMED)      Component Value Range   Opiates NONE DETECTED  NONE DETECTED    Cocaine NONE DETECTED  NONE DETECTED    Benzodiazepines NONE DETECTED  NONE DETECTED    Amphetamines NONE  DETECTED  NONE DETECTED    Tetrahydrocannabinol NONE DETECTED  NONE DETECTED    Barbiturates NONE DETECTED  NONE DETECTED    2345Ella Hyacinth Meeker, Behavioral Health ACT saw and evaluated patient.Patient denies suicidal ideation, homicidal ideation and is not psychotic. She had agreed to sign a no harm contract and return to the group home. The group home has agreed to take her back. The Sheriff will transport her home.  MDM  Patient with borderline personality, depression, mood disorder who lives in a group home and ran away. . She has contracted for safety and will return to the group home.   I personally performed the services described in this documentation, which was scribed in my presence. The recorded information has been reviewed and considered.   MDM Reviewed: nursing note and vitals Interpretation: labs Consults: Behavioral Health.    1255 Patient ready for discharge when she changed her mind. She will not honor the no harm contract. She states she will cut herself again with the intent of hurting or killing herself. Spoke ith Hattie Perch, ACT. Advised that I would complete IVC paperwork. Sheriff is at the bedside. Plan for transfer to [facility] for care.        Nicoletta Dress. Colon Branch, MD 09/11/11 0103  Nicoletta Dress. Colon Branch, MD 09/11/11 1610

## 2011-09-11 ENCOUNTER — Encounter (HOSPITAL_COMMUNITY): Payer: Self-pay | Admitting: Emergency Medicine

## 2011-09-11 LAB — BASIC METABOLIC PANEL
BUN: 16 mg/dL (ref 6–23)
CO2: 21 mEq/L (ref 19–32)
Chloride: 103 mEq/L (ref 96–112)
Creatinine, Ser: 0.84 mg/dL (ref 0.50–1.10)
GFR calc Af Amer: >90 mL/min (ref >90)
Glucose, Bld: 121 mg/dL — ABNORMAL HIGH (ref 70–99)
Potassium: 3.9 mEq/L (ref 3.5–5.1)

## 2011-09-11 LAB — CBC
HCT: 40.8 % (ref 36.0–46.0)
Hemoglobin: 13.3 g/dL (ref 12.0–15.0)
RBC: 4.66 MIL/uL (ref 3.87–5.11)

## 2011-09-11 LAB — DIFFERENTIAL
Lymphocytes Relative: 31 % (ref 12–46)
Lymphs Abs: 3.3 10*3/uL (ref 0.7–4.0)
Monocytes Absolute: 0.8 10*3/uL (ref 0.1–1.0)
Monocytes Relative: 7 % (ref 3–12)
Neutro Abs: 6.5 10*3/uL (ref 1.7–7.7)
Neutrophils Relative %: 61 % (ref 43–77)

## 2011-09-11 MED ORDER — TRAZODONE HCL 50 MG PO TABS
100.0000 mg | ORAL_TABLET | Freq: Two times a day (BID) | ORAL | Status: DC
  Administered 2011-09-11: 100 mg via ORAL
  Filled 2011-09-11 (×5): qty 2

## 2011-09-11 MED ORDER — ONDANSETRON HCL 4 MG PO TABS: 4.0000 mg | ORAL_TABLET | Freq: Three times a day (TID) | ORAL | Status: DC | PRN

## 2011-09-11 MED ORDER — CITALOPRAM HYDROBROMIDE 20 MG PO TABS
20.0000 mg | ORAL_TABLET | Freq: Every day | ORAL | Status: DC
  Administered 2011-09-11: 20 mg via ORAL
  Filled 2011-09-11 (×2): qty 1

## 2011-09-11 MED ORDER — NICOTINE 21 MG/24HR TD PT24
21.0000 mg | MEDICATED_PATCH | Freq: Every day | TRANSDERMAL | Status: DC
  Administered 2011-09-11: 21 mg via TRANSDERMAL
  Filled 2011-09-11 (×2): qty 1

## 2011-09-11 MED ORDER — TIOTROPIUM BROMIDE MONOHYDRATE 18 MCG IN CAPS
18.0000 ug | ORAL_CAPSULE | Freq: Every day | RESPIRATORY_TRACT | Status: DC
  Administered 2011-09-11: 18 ug via RESPIRATORY_TRACT
  Filled 2011-09-11: qty 5

## 2011-09-11 MED ORDER — ZIPRASIDONE HCL 60 MG PO CAPS
60.0000 mg | ORAL_CAPSULE | Freq: Two times a day (BID) | ORAL | Status: DC
  Administered 2011-09-11: 60 mg via ORAL
  Filled 2011-09-11 (×4): qty 1

## 2011-09-11 MED ORDER — IBUPROFEN 400 MG PO TABS: 600.0000 mg | ORAL_TABLET | Freq: Three times a day (TID) | ORAL | Status: DC | PRN

## 2011-09-11 MED ORDER — SIMVASTATIN 20 MG PO TABS
20.0000 mg | ORAL_TABLET | Freq: Every day | ORAL | Status: DC
  Administered 2011-09-11: 20 mg via ORAL
  Filled 2011-09-11 (×2): qty 1

## 2011-09-11 MED ORDER — ACETAMINOPHEN 325 MG PO TABS: 650.0000 mg | ORAL_TABLET | ORAL | Status: DC | PRN

## 2011-09-11 MED ORDER — DOCUSATE SODIUM 100 MG PO CAPS
100.0000 mg | ORAL_CAPSULE | Freq: Two times a day (BID) | ORAL | Status: DC
  Administered 2011-09-11: 100 mg via ORAL
  Filled 2011-09-11 (×5): qty 1

## 2011-09-11 NOTE — ED Notes (Signed)
Spoke with Annice Pih from [employer] from Home group home - will send someone to pick up pt at this time.

## 2011-09-11 NOTE — ED Notes (Signed)
Pt belongings returned to pt and pt dressed awaiting for ride from group home to arrive.  nad noted.

## 2011-09-11 NOTE — ED Notes (Signed)
Pt requesting to take shower - Sitter escorted pt to shower.  Pt calm, cooperative, pleasant.  nad noted.

## 2011-09-11 NOTE — ED Provider Notes (Signed)
Patient is no longer a threat to herself or others on reassessment today. States she will go back to her group home where she'll be safe. She signed a Engineer, manufacturing systems.  Dayton Bailiff, MD 09/11/11 1023

## 2011-09-11 NOTE — ED Notes (Signed)
Dr. Strand at bedside. 

## 2011-09-11 NOTE — ED Notes (Signed)
Pt up to bathroom with sitter.  nad noted.  Pt calm, cooperative.

## 2011-09-11 NOTE — ED Provider Notes (Signed)
  Physical Exam  BP 106/37  Pulse 56  Temp(Src) 98.2 F (36.8 C) (Oral)  Resp 18  Ht 4\' 11"  (1.499 m)  Wt 220 lb (99.791 kg)  BMI 44.43 kg/m2  SpO2 100%  Physical Exam  ED Course  Procedures  MDM  Pt Assessed. Sleeping comfortably in bed with snoring respirations. NAD. Awaiting placement, possibly [facility].       Raeford Razor, MD 09/11/11 [phone]

## 2011-09-11 NOTE — BH Assessment (Signed)
Assessment Note   Cynthia Phelps is an 20 y.o. female.   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 Asthma   . Hyperlipidemia, acquired   . Borderline personality disorder   . Anxiety   . Mood disorder   . Depression    Axis IV: problems with primary support group Axis V: 51-60 moderate symptoms  Past Medical History:  Past Medical History  Diagnosis Date  . Asthma   . Hyperlipidemia, acquired   . Borderline personality disorder   . Anxiety   . Mood disorder   . Depression     Past Surgical History  Procedure Date  . Open cystotomy   . Foreign body removal 08/29/2011    Procedure: FOREIGN BODY REMOVAL ADULT;  Surgeon: Antony Haste, MD;  Location: [facility];  Service: Urology;  Laterality: N/A;  . Cystoscopy 08/29/2011    Procedure: CYSTOSCOPY;  Surgeon: Antony Haste, MD;  Location: [facility];  Service: Urology;  Laterality: N/A;  with right retrograde and cystogram  . Bladder surgery     Family History: History reviewed. No pertinent family history.  Social History:  reports that she has been smoking Cigarettes.  She has been smoking about 1 pack per day. She has never used smokeless tobacco. She reports that she does not drink alcohol or use illicit drugs.  Allergies:  Allergies  Allergen Reactions  . Haldol (Haloperidol Decanoate) Shortness Of Breath and Other (See Comments)    Freezes up and tongue sticks out ,Unable to move arms and legs, muscle stiffness   . Latex     Sensitivity, not severe  . Doxycycline Itching, Rash and Other (See Comments)    Unable to move arms and legs, muscle stiffness  . Tetracyclines & Related Itching, Rash and Other (See Comments)    Unable to move arms and legs, muscle stiffness    Home Medications:  Medications Prior to Admission  Medication Dose Route Frequency Provider Last Rate Last Dose  . acetaminophen (TYLENOL) tablet 650 mg   650 mg Oral Q4H PRN Nicoletta Dress. Colon Branch, MD      . citalopram (CELEXA) tablet 20 mg  20 mg Oral Daily Nicoletta Dress. Colon Branch, MD   20 mg at 09/11/11 0902  . docusate sodium (COLACE) capsule 100 mg  100 mg Oral BID Nicoletta Dress. Colon Branch, MD   100 mg at 09/11/11 0902  . ibuprofen (ADVIL,MOTRIN) tablet 600 mg  600 mg Oral Q8H PRN Nicoletta Dress. Colon Branch, MD      . nicotine (NICODERM CQ - dosed in mg/24 hours) patch 21 mg  21 mg Transdermal Daily Nicoletta Dress. Colon Branch, MD   21 mg at 09/11/11 0901  . ondansetron (ZOFRAN) tablet 4 mg  4 mg Oral Q8H PRN Nicoletta Dress. Colon Branch, MD      . simvastatin (ZOCOR) tablet 20 mg  20 mg Oral Daily Nicoletta Dress. Colon Branch, MD   20 mg at 09/11/11 0901  . tiotropium (SPIRIVA) inhalation capsule 18 mcg  18 mcg Inhalation Daily Nicoletta Dress. Colon Branch, MD   18 mcg at 09/11/11 0921  . traZODone (DESYREL) tablet 100 mg  100 mg Oral BID Nicoletta Dress. Colon Branch, MD   100 mg at 09/11/11 0901  . ziprasidone (GEODON) capsule 60 mg  60 mg Oral BID WC Nicoletta Dress. Colon Branch, MD   60 mg at 09/11/11 0902   Medications Prior to Admission  Medication Sig Dispense Refill  .  citalopram (CELEXA) 20 MG tablet Take 1 tablet (20 mg total) by mouth daily.  30 tablet  0  . tiotropium (SPIRIVA) 18 MCG inhalation capsule Place 18 mcg into inhaler and inhale daily.        Marland Kitchen docusate sodium (COLACE) 100 MG capsule Take 100 mg by mouth 2 (two) times daily.        . medroxyPROGESTERone (DEPO-PROVERA) 150 MG/ML injection Inject 1 mL (150 mg total) into the muscle every 3 (three) months.      . simvastatin (ZOCOR) 20 MG tablet Take 20 mg by mouth daily.        Marland Kitchen sulfamethoxazole-trimethoprim (BACTRIM DS) 800-160 MG per tablet Take 1 tablet by mouth daily.  9 tablet  0    OB/GYN Status:  No LMP recorded. Patient has had an injection.  General Assessment Data Assessment Number: 1  Living Arrangements: Group Home Can pt return to current living arrangement?: Yes Admission Status: Voluntary Is patient capable of signing voluntary admission?: No Transfer from:  Group Home Referral Source: Other [facility] [phone])  Risk to self Suicidal Ideation: No Suicidal Intent: No Is patient at risk for suicide?: No Suicidal Plan?: No Access to Means: No What has been your use of drugs/alcohol within the last 12 months?: NA Other Self Harm Risks: MADE SUPERFICIAL CUT TO LEFT WRIST Triggers for Past Attempts: Other (Comment) (LIVING QUARTERS) Intentional Self Injurious Behavior: Cutting Comment - Self Injurious Behavior: YES Factors that decrease suicide risk: Positive therapeutic relationships Family Suicide History: No Recent stressful life event(s): Recent negative physical changes (08/24/11 MOVED TO NEW GROUP HOME) Persecutory voices/beliefs?: No Depression: Yes Depression Symptoms: Feeling worthless/self pity;Feeling angry/irritable;Loss of interest in usual pleasures;Isolating Substance abuse history and/or treatment for substance abuse?: No Suicide prevention information given to non-admitted patients: Yes  Risk to Others Homicidal Ideation: No Thoughts of Harm to Others: No Current Homicidal Intent: No Current Homicidal Plan: No Access to Homicidal Means: No Identified Victim: NA History of harm to others?: No Assessment of Violence: None Noted Violent Behavior Description: NA Does patient have access to weapons?: No Criminal Charges Pending?: No Does patient have a court date: No  Mental Status Report Appear/Hygiene: Improved Eye Contact: Good Motor Activity: Freedom of movement Speech: Logical/coherent Level of Consciousness: Alert Mood: Depressed;Worthless, low self-esteem Affect: Depressed;Sad Anxiety Level: Minimal Thought Processes: Coherent;Relevant Judgement: Impaired Orientation: Person;Place;Time;Situation Obsessive Compulsive Thoughts/Behaviors: None  Cognitive Functioning Concentration: Normal Memory: Recent Intact;Remote Intact IQ: Average Insight: Poor Impulse Control: Poor Appetite: Good Sleep: No  Change Total Hours of Sleep: 8  Vegetative Symptoms: None  Prior Inpatient/Outpatient Therapy Prior Therapy: Inpatient Prior Therapy Dates: UNK Prior Therapy Facilty/Provider(s): [facility] Reason for Treatment: SELF-MULITATION            Values / Beliefs Cultural Requests During Hospitalization: None Spiritual Requests During Hospitalization: None        Additional Information 1:1 In Past 12 Months?: No CIRT Risk: No Elopement Risk: No Does patient have medical clearance?: Yes  Child/Adolescent Assessment Running Away Risk: Denies  Disposition:  Disposition Disposition of Patient: Inpatient treatment program Type of inpatient treatment program: Adult Upon reevaluation this am, the patient now request to return to the group home. Last night she signed a Engineer, manufacturing systems and was discharged. At that time she refused to leave the ED, stating that she would hurt herself. She was readmitted at that time and made IVC. This morning she was reevaluated. She now wishes to return to the group  home. She again tells writer that she does not like the homoe and would like to go somewhere else. Writer explained that she should contact her worker in [place] and ask for a transfer, she stated that she would do this At this time, the patient does not express any SI or HI. She is not hallucinated nor is she delusional. She does contract for safety, agreeing to return to the group home. She will continue to take medications as prescribed, and continue with regular follow up with [facility]. Petition rescinded as the patient no longer meets criteria for IVC.  Dayton Bailiff MD, agrees with disposition.  On Site Evaluation by:   Reviewed with Physician:     Jearld Pies 09/11/2011 9:36 AM

## 2011-09-11 NOTE — ED Notes (Signed)
Summoned to patient room. Patient states "I do not want to go back to the group home. I do not feel safe there. If you send me back to the group home, I will hurt myself again and be back over here." Advised Dr Colon Branch.

## 2011-09-12 DIAGNOSIS — F3289 Other specified depressive episodes: Secondary | ICD-10-CM | POA: Insufficient documentation

## 2011-09-12 DIAGNOSIS — J45909 Unspecified asthma, uncomplicated: Secondary | ICD-10-CM | POA: Insufficient documentation

## 2011-09-12 DIAGNOSIS — F17200 Nicotine dependence, unspecified, uncomplicated: Secondary | ICD-10-CM | POA: Insufficient documentation

## 2011-09-12 DIAGNOSIS — E785 Hyperlipidemia, unspecified: Secondary | ICD-10-CM | POA: Insufficient documentation

## 2011-09-12 DIAGNOSIS — F329 Major depressive disorder, single episode, unspecified: Secondary | ICD-10-CM | POA: Insufficient documentation

## 2011-09-12 DIAGNOSIS — Z23 Encounter for immunization: Secondary | ICD-10-CM | POA: Insufficient documentation

## 2011-09-12 DIAGNOSIS — F603 Borderline personality disorder: Secondary | ICD-10-CM | POA: Insufficient documentation

## 2011-09-12 DIAGNOSIS — F411 Generalized anxiety disorder: Secondary | ICD-10-CM | POA: Insufficient documentation

## 2011-09-12 DIAGNOSIS — R45851 Suicidal ideations: Secondary | ICD-10-CM | POA: Insufficient documentation

## 2011-09-13 ENCOUNTER — Emergency Department (HOSPITAL_COMMUNITY): Payer: Medicaid Other

## 2011-09-13 ENCOUNTER — Emergency Department (HOSPITAL_COMMUNITY)
Admission: EM | Admit: 2011-09-13 | Discharge: 2011-09-22 | Disposition: A | Discharge status: Home / Self Care | Payer: Medicaid Other | Attending: Emergency Medicine | Admitting: Emergency Medicine

## 2011-09-13 ENCOUNTER — Encounter (HOSPITAL_COMMUNITY): Payer: Self-pay | Admitting: *Deleted

## 2011-09-13 DIAGNOSIS — F329 Major depressive disorder, single episode, unspecified: Secondary | ICD-10-CM

## 2011-09-13 DIAGNOSIS — R45851 Suicidal ideations: Secondary | ICD-10-CM

## 2011-09-13 LAB — DIFFERENTIAL
Lymphocytes Relative: 39 % (ref 12–46)
Lymphs Abs: 2.9 10*3/uL (ref 0.7–4.0)
Monocytes Relative: 5 % (ref 3–12)
Neutro Abs: 4 10*3/uL (ref 1.7–7.7)
Neutrophils Relative %: 54 % (ref 43–77)

## 2011-09-13 LAB — URINALYSIS, ROUTINE W REFLEX MICROSCOPIC
Bilirubin Urine: NEGATIVE
Hgb urine dipstick: NEGATIVE
Ketones, ur: NEGATIVE mg/dL
Nitrite: NEGATIVE
Specific Gravity, Urine: 1.02 (ref 1.005–1.030)
Urobilinogen, UA: 0.2 mg/dL (ref 0.0–1.0)

## 2011-09-13 LAB — RAPID URINE DRUG SCREEN, HOSP PERFORMED
Barbiturates: NOT DETECTED
Benzodiazepines: NOT DETECTED
Cocaine: NOT DETECTED
Opiates: NOT DETECTED
Tetrahydrocannabinol: NOT DETECTED

## 2011-09-13 LAB — CBC
Hemoglobin: 13.6 g/dL (ref 12.0–15.0)
MCH: 28.6 pg (ref 26.0–34.0)
Platelets: 286 10*3/uL (ref 150–400)
RBC: 4.76 MIL/uL (ref 3.87–5.11)
WBC: 7.3 10*3/uL (ref 4.0–10.5)

## 2011-09-13 LAB — BASIC METABOLIC PANEL
BUN: 13 mg/dL (ref 6–23)
CO2: 26 mEq/L (ref 19–32)
Chloride: 106 mEq/L (ref 96–112)
Glucose, Bld: 88 mg/dL (ref 70–99)
Potassium: 3.6 mEq/L (ref 3.5–5.1)
Sodium: 140 mEq/L (ref 135–145)

## 2011-09-13 MED ORDER — ALUM & MAG HYDROXIDE-SIMETH 200-200-20 MG/5ML PO SUSP: 30.0000 mL | ORAL | Status: DC | PRN

## 2011-09-13 MED ORDER — NICOTINE 21 MG/24HR TD PT24
21.0000 mg | MEDICATED_PATCH | Freq: Every day | TRANSDERMAL | Status: DC
  Administered 2011-09-13 – 2011-09-22 (×9): 21 mg via TRANSDERMAL
  Filled 2011-09-13 (×6): qty 1

## 2011-09-13 MED ORDER — ZOLPIDEM TARTRATE 5 MG PO TABS
5.0000 mg | ORAL_TABLET | Freq: Every evening | ORAL | Status: DC | PRN
  Administered 2011-09-13 – 2011-09-19 (×5): 5 mg via ORAL
  Filled 2011-09-13 (×2): qty 1

## 2011-09-13 MED ORDER — CITALOPRAM HYDROBROMIDE 20 MG PO TABS
20.0000 mg | ORAL_TABLET | Freq: Every day | ORAL | Status: DC
  Administered 2011-09-13 – 2011-09-22 (×10): 20 mg via ORAL
  Filled 2011-09-13 (×10): qty 1

## 2011-09-13 MED ORDER — ACETAMINOPHEN 325 MG PO TABS
650.0000 mg | ORAL_TABLET | ORAL | Status: DC | PRN
  Administered 2011-09-18 – 2011-09-21 (×5): 650 mg via ORAL

## 2011-09-13 MED ORDER — ZIPRASIDONE HCL 60 MG PO CAPS
60.0000 mg | ORAL_CAPSULE | Freq: Two times a day (BID) | ORAL | Status: DC
  Administered 2011-09-13 – 2011-09-22 (×17): 60 mg via ORAL
  Filled 2011-09-13 (×16): qty 1

## 2011-09-13 MED ORDER — ONDANSETRON HCL 4 MG PO TABS: 4.0000 mg | ORAL_TABLET | Freq: Three times a day (TID) | ORAL | Status: DC | PRN

## 2011-09-13 MED ORDER — LORAZEPAM 1 MG PO TABS
1.0000 mg | ORAL_TABLET | Freq: Three times a day (TID) | ORAL | Status: DC | PRN
  Administered 2011-09-13 – 2011-09-20 (×4): 1 mg via ORAL
  Filled 2011-09-13: qty 1

## 2011-09-13 MED ORDER — IBUPROFEN 400 MG PO TABS: 600.0000 mg | ORAL_TABLET | Freq: Three times a day (TID) | ORAL | Status: DC | PRN

## 2011-09-13 NOTE — ED Notes (Signed)
I went in to exam pt with Rebecca Eaton NT at bedside; no crayon noted to be in urethral opening or vaginal opening, small flake of blue crayon noted on outer vaginal lip; Rebecca Eaton NT had pt bear down like she had to urinate and pt had forceful stream of urine and able to stop on command; Dr. Deretha Emory notified of situation and he gave no new orders; Dr. Deretha Emory verbalized no need for consult to urology at this time

## 2011-09-13 NOTE — ED Notes (Signed)
;  pt states she is still wanting to harm herself. IVC papers taken out by the group home. Pt was seen in the ED a few days ago for the same.

## 2011-09-13 NOTE — ED Notes (Signed)
edp signed off on [facility] staying w/ pt. Understanding that they would return for any problems w/ pt.

## 2011-09-13 NOTE — ED Notes (Signed)
[facility] called to let us know that Dr. Willeen Niece had denied Pt due to acuity.  Nurse informed.

## 2011-09-13 NOTE — ED Notes (Signed)
Pt awake asked for meds to help sleep, also snack, crackers given w/ water, warm blanket  Sitter remains at bedside

## 2011-09-13 NOTE — ED Provider Notes (Signed)
History     CSN: [id number] Arrival date & time: 09/13/2011 12:28 AM   First MD Initiated Contact with Patient 09/13/11 [phone]      Chief Complaint  Patient presents with  . Suicidal    (Consider location/radiation/quality/duration/timing/severity/associated sxs/prior treatment) HPI Comments: 20 year old female with a history of suicidal behavior is and self mutilating behaviors presents from her group home under involuntary commitment because of ongoing suicidal comments. She was seen in the emergency department 2 days ago for similar complaints and after an ER stay with evaluation by psychiatry she was found to be not a danger to herself, willing to sign a no harm contract. She was discharged home and returns today because of recurrent suicidal thoughts. She has been stating to the group home liters and that she is suicidal, she sites the fact that she does not get along with anybody there and that they "don't do anything", and is upset because she just sits around all day with nothing to do. She says she gets along with nobody, has no friends and nothing makes her happy. She manually removed the Dermabond from her left wrist laceration which was repaired in the last few days. There is no bleeding.  Suicidal thoughts or gradually worsening She plans overdose and states she has access to her medications because they're "not locked up" Gradually getting worse No associated fevers chills nausea or vomiting  The history is provided by the patient and medical records.    Past Medical History  Diagnosis Date  . Asthma   . Hyperlipidemia, acquired   . Borderline personality disorder   . Anxiety   . Mood disorder   . Depression     Past Surgical History  Procedure Date  . Open cystotomy   . Foreign body removal 08/29/2011    Procedure: FOREIGN BODY REMOVAL ADULT;  Surgeon: Antony Haste, MD;  Location: [facility];  Service: Urology;  Laterality: N/A;  . Cystoscopy 08/29/2011   Procedure: CYSTOSCOPY;  Surgeon: Antony Haste, MD;  Location: [facility];  Service: Urology;  Laterality: N/A;  with right retrograde and cystogram  . Bladder surgery     History reviewed. No pertinent family history.  History  Substance Use Topics  . Smoking status: Current Everyday Smoker -- 1.0 packs/day    Types: Cigarettes  . Smokeless tobacco: Never Used   Comment: unknown at this time  . Alcohol Use: No    OB History    Grav Para Term Preterm Abortions TAB SAB Ect Mult Living                  Review of Systems  All other systems reviewed and are negative.    Allergies  Haldol; Latex; Doxycycline; and Tetracyclines & related  Home Medications   Current Outpatient Rx  Name Route Sig Dispense Refill  . CITALOPRAM HYDROBROMIDE 20 MG PO TABS Oral Take 1 tablet (20 mg total) by mouth daily. 30 tablet 0  . DOCUSATE SODIUM 100 MG PO CAPS Oral Take 100 mg by mouth 2 (two) times daily.      Marland Kitchen MEDROXYPROGESTERONE ACETATE 150 MG/ML IM SUSP Intramuscular Inject 1 mL (150 mg total) into the muscle every 3 (three) months.      Previous home medication.  Marland Kitchen SIMVASTATIN 20 MG PO TABS Oral Take 20 mg by mouth daily.      Marland Kitchen TIOTROPIUM BROMIDE MONOHYDRATE 18 MCG IN CAPS Inhalation Place 18 mcg into inhaler and inhale daily.      Marland Kitchen  TRAZODONE HCL 100 MG PO TABS Oral Take 100 mg by mouth 2 (two) times daily. Take one tablet every morning and two tablets and two at bedtime     . ZIPRASIDONE HCL 60 MG PO CAPS Oral Take 60 mg by mouth 2 (two) times daily with a meal.        BP 117/68  Pulse 68  Temp(Src) 97.8 F (36.6 C) (Oral)  Resp 22  SpO2 100%  Physical Exam  Nursing note and vitals reviewed. Constitutional: She appears well-developed and well-nourished. No distress.  HENT:  Head: Normocephalic and atraumatic.  Mouth/Throat: Oropharynx is clear and moist. No oropharyngeal exudate.  Eyes: Conjunctivae and EOM are normal. Pupils are equal, round, and reactive to light.  Right eye exhibits no discharge. Left eye exhibits no discharge. No scleral icterus.  Neck: Normal range of motion. Neck supple. No JVD present. No thyromegaly present.  Cardiovascular: Normal rate, regular rhythm, normal heart sounds and intact distal pulses.  Exam reveals no gallop and no friction rub.   No murmur heard. Pulmonary/Chest: Effort normal and breath sounds normal. No respiratory distress. She has no wheezes. She has no rales.  Abdominal: Soft. Bowel sounds are normal. She exhibits no distension and no mass. There is no tenderness.  Musculoskeletal: Normal range of motion. She exhibits no edema and no tenderness.  Lymphadenopathy:    She has no cervical adenopathy.  Neurological: She is alert. Coordination normal.  Skin: Skin is warm and dry. No rash noted. No erythema.       Wound to the left volar wrist, several centimeters in length, sallow, nonbleeding, no surrounding erythema or swelling or tenderness  Psychiatric:       Suicidal thoughts Depression    ED Course  Procedures (including critical care time)   Labs Reviewed  CBC  DIFFERENTIAL  BASIC METABOLIC PANEL  ETHANOL  URINE RAPID DRUG SCREEN (HOSP PERFORMED)  URINALYSIS, ROUTINE W REFLEX MICROSCOPIC  POCT PREGNANCY, URINE   Dg Abd 1 View  09/13/2011  *RADIOLOGY REPORT*  Clinical Data: Concern for foreign body in bladder.  ABDOMEN - 1 VIEW  Comparison: Abdominal radiograph performed 08/25/2011  Findings: No radiopaque foreign body is identified within the bladder.  The bladder is grossly unremarkable in appearance.  The visualized bowel gas pattern is within normal limits. Scattered stool is noted within the colon; air-filled loops of small bowel are seen, without evidence of small bowel dilatation to suggest obstruction.  No free intra-abdominal air is identified, though evaluation for free air is suboptimal on a single supine view.  No acute osseous abnormalities are seen.  IMPRESSION: No radiopaque foreign body  is seen within the bladder.  Original Report Authenticated By: Tonia Ghent, M.D.     1. Depression   2. Suicidal thoughts       MDM  Ongoing suicidal thoughts, vital signs stable, patient appears flat and depressed. Behavioural health evaluation pending      Change of shift - care signed out to Dr. Lynelle Doctor - [facility] team to eval this AM.  Vida Roller, MD 09/13/11 [phone]

## 2011-09-13 NOTE — ED Notes (Signed)
Pt was doing a word search with a crayon. Pt continuously asked for staff pen. Pt was told that she could not have a pen, and that she could only use the crayon that was given to her by RN.

## 2011-09-13 NOTE — ED Notes (Signed)
Pt resting calmly w/ eyes closed. Rise & fall of the chest noted. Family at bedside. Bed in low position, side rails up x2. NAD noted at this time.  

## 2011-09-13 NOTE — ED Notes (Signed)
Pt requested shower. Pt was given hygiene products and taken to the shower while staff observed pt. Pt took a long shower. Pt stated she felt a lot better after having a shower. All pt's linens were changed and pt was given 2 new warm blankets. Pt was also given a coke and a grape juice. Sitter present at bedsdie. NAD noted at this time.

## 2011-09-13 NOTE — ED Notes (Addendum)
While sitting with pt, pt was noted scratching her vaginal area for approximately 2 minutes. Sitter was changed at 1700. Pt told new sitter that she had stuck a crayon in her bladder. Dr.Zammit and RN,Tina made aware.

## 2011-09-13 NOTE — ED Notes (Signed)
[facility] called to let Cynthia Phelps know that they had denied Pt due to acuity.  Nurse informed.

## 2011-09-13 NOTE — ED Notes (Signed)
Pt given dinner tray, no utensils on tray; I asked pt when did she insert the crayon and she stated she did it while she was in the bed; I asked pt why and she stated "because I was upset";
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
## 2011-09-13 NOTE — ED Notes (Signed)
[facility] called to inform us that Dr. Ilean Skill had denied Pt due to her needing long term care.  Nurse informed.

## 2011-09-13 NOTE — ED Notes (Signed)
Pt resting calmly w/ eyes closed. Rise & fall of the chest noted. Family at bedside. Bed in low position, side rails up x2. Sitter remains sitting outside pt room.  NAD noted at this time.

## 2011-09-13 NOTE — BH Assessment (Signed)
Assessment Note   Cynthia Phelps is an 20 y.o. female. PT CONTINUES TO COME TO THE ER STATING SHE WANTS TO KILL HERSELF BECAUSE PEOPLE PICK ON HER AT HER GROUP HOME. ON 09/10/11 PT DID MAKE SUPERFICIAL CUTS TO HER LEFT WRIST STATING SHE WAS SUICIDAL THEN RECANTED AND SIGNED A NO HARM CONTRACT.SPOKE WITH LINDA GAY, GROUP HOME OWNER-[phone],  WHO REPORTED NO OTHER RESIDENTS ARE PICKING ON THIS PATIENT, SHE IS VERY MANIPULATIVE AND WANTS TO HAVE HER WAY. SHE IS BORDERLINE AND AND ALWAYS ATTENTION SEEKING.  PT IS NOT AGGRESSIVE NOR VIOLENT BUT DOES HAVE A HISTORY OF INSERTING OBJECTS IN HER URETHRA. (SEE DR Aurther Loft STRAND'S NOTE DATED 09/10/11.  PT HAS NOT DONE ANYTHING AT THIS POINT TO HURT HERSELF BUT DOES THREATEN TO CUT SELF OR OVERDOSE ON PILLS IN THE GROUP HOME WHICH SHE REPORTS SHE CAN GET HER HANDS ON.PT HAS HAS PREVIOUS ADMISSIONS TO [facility], [facility], [facility] AND OTHER UNK FACILITIES PER PATIENT. PT IS UNABLE TO CONTRACT FOR SAFETY. PT WILL BE REFERRED FOR INPATIENT ADMISSION AT THIS TIME AS SHE IS UNDER IVOLUNTARY COMMITMENT. ED PHYSICIAN, IVA KNAPP AGREES WITH DISPOSITION.         Axis I: Adjustment Disorder with Depressed Mood Axis II: Borderline Personality Dis. Axis III:  Past Medical History  Diagnosis Date  . Asthma   . Hyperlipidemia, acquired   . Borderline personality disorder   . Anxiety   . Mood disorder   . Depression    Axis IV: problems related to social environment Axis V: 11-20 some danger of hurting self or others possible OR occasionally fails to maintain minimal personal hygiene OR gross impairment in communication  Past Medical History:  Past Medical History  Diagnosis Date  . Asthma   . Hyperlipidemia, acquired   . Borderline personality disorder   . Anxiety   . Mood disorder   . Depression     Past Surgical History  Procedure Date  . Open cystotomy   . Foreign body removal 08/29/2011    Procedure: FOREIGN BODY REMOVAL ADULT;   Surgeon: Antony Haste, MD;  Location: [facility];  Service: Urology;  Laterality: N/A;  . Cystoscopy 08/29/2011    Procedure: CYSTOSCOPY;  Surgeon: Antony Haste, MD;  Location: [facility];  Service: Urology;  Laterality: N/A;  with right retrograde and cystogram  . Bladder surgery     Family History: History reviewed. No pertinent family history.  Social History:  reports that she has been smoking Cigarettes.  She has been smoking about 1 pack per day. She has never used smokeless tobacco. She reports that she does not drink alcohol or use illicit drugs.  Allergies:  Allergies  Allergen Reactions  . Haldol (Haloperidol Decanoate) Shortness Of Breath and Other (See Comments)    Freezes up and tongue sticks out ,Unable to move arms and legs, muscle stiffness   . Latex     Sensitivity, not severe  . Doxycycline Itching, Rash and Other (See Comments)    Unable to move arms and legs, muscle stiffness  . Tetracyclines & Related Itching, Rash and Other (See Comments)    Unable to move arms and legs, muscle stiffness    Home Medications:  Medications Prior to Admission  Medication Dose Route Frequency Provider Last Rate Last Dose  . acetaminophen (TYLENOL) tablet 650 mg  650 mg Oral Q4H PRN Vida Roller, MD      . alum & mag hydroxide-simeth (MAALOX/MYLANTA) 200-200-20 MG/5ML suspension 30 mL  30 mL Oral PRN Vida Roller, MD      . citalopram (CELEXA) tablet 20 mg  20 mg Oral Daily Vida Roller, MD   20 mg at 09/13/11 0926  . ibuprofen (ADVIL,MOTRIN) tablet 600 mg  600 mg Oral Q8H PRN Vida Roller, MD      . LORazepam (ATIVAN) tablet 1 mg  1 mg Oral Q8H PRN Vida Roller, MD      . nicotine (NICODERM CQ - dosed in mg/24 hours) patch 21 mg  21 mg Transdermal Daily Vida Roller, MD   21 mg at 09/13/11 1610  . ondansetron (ZOFRAN) tablet 4 mg  4 mg Oral Q8H PRN Vida Roller, MD      . ziprasidone (GEODON) capsule 60 mg  60 mg Oral BID WC Vida Roller, MD   60 mg at  09/13/11 9604  . zolpidem (AMBIEN) tablet 5 mg  5 mg Oral QHS PRN Vida Roller, MD      . DISCONTD: acetaminophen (TYLENOL) tablet 650 mg  650 mg Oral Q4H PRN Nicoletta Dress. Colon Branch, MD      . DISCONTD: citalopram (CELEXA) tablet 20 mg  20 mg Oral Daily Nicoletta Dress. Colon Branch, MD   20 mg at 09/11/11 0902  . DISCONTD: docusate sodium (COLACE) capsule 100 mg  100 mg Oral BID Nicoletta Dress. Colon Branch, MD   100 mg at 09/11/11 0902  . DISCONTD: ibuprofen (ADVIL,MOTRIN) tablet 600 mg  600 mg Oral Q8H PRN Nicoletta Dress. Colon Branch, MD      . DISCONTD: nicotine (NICODERM CQ - dosed in mg/24 hours) patch 21 mg  21 mg Transdermal Daily Nicoletta Dress. Colon Branch, MD   21 mg at 09/11/11 0901  . DISCONTD: ondansetron (ZOFRAN) tablet 4 mg  4 mg Oral Q8H PRN Nicoletta Dress. Colon Branch, MD      . DISCONTD: simvastatin (ZOCOR) tablet 20 mg  20 mg Oral Daily Nicoletta Dress. Colon Branch, MD   20 mg at 09/11/11 0901  . DISCONTD: tiotropium (SPIRIVA) inhalation capsule 18 mcg  18 mcg Inhalation Daily Nicoletta Dress. Colon Branch, MD   18 mcg at 09/11/11 0921  . DISCONTD: traZODone (DESYREL) tablet 100 mg  100 mg Oral BID Nicoletta Dress. Colon Branch, MD   100 mg at 09/11/11 0901  . DISCONTD: ziprasidone (GEODON) capsule 60 mg  60 mg Oral BID WC Nicoletta Dress. Colon Branch, MD   60 mg at 09/11/11 0902   Medications Prior to Admission  Medication Sig Dispense Refill  . albuterol (PROVENTIL HFA;VENTOLIN HFA) 108 (90 BASE) MCG/ACT inhaler Inhale 2 puffs into the lungs every 4 (four) hours as needed. For shortness of breath       . aspirin EC 81 MG tablet Take 81 mg by mouth daily.        . citalopram (CELEXA) 20 MG tablet Take 1 tablet (20 mg total) by mouth daily.  30 tablet  0  . cloZAPine (CLOZARIL) 100 MG tablet Take 200 mg by mouth daily.        Marland Kitchen omega-3 acid ethyl esters (LOVAZA) 1 G capsule Take 1 g by mouth 2 (two) times daily.        . polyethylene glycol powder (GLYCOLAX/MIRALAX) powder Take 17 g by mouth daily.        . simvastatin (ZOCOR) 20 MG tablet Take 20 mg by mouth daily.        Marland Kitchen tiotropium  (SPIRIVA) 18 MCG inhalation capsule Place 18 mcg into inhaler and inhale daily.        Marland Kitchen  traZODone (DESYREL) 100 MG tablet Take 100-200 mg by mouth at bedtime.       . ziprasidone (GEODON) 60 MG capsule Take 60 mg by mouth 2 (two) times daily with a meal.        . medroxyPROGESTERone (DEPO-PROVERA) 150 MG/ML injection Inject 1 mL (150 mg total) into the muscle every 3 (three) months.      . sulfamethoxazole-trimethoprim (BACTRIM DS) 800-160 MG per tablet Take 1 tablet by mouth daily.  9 tablet  0    OB/GYN Status:  No LMP recorded. Patient has had an injection.  General Assessment Data Assessment Number: 2  Living Arrangements: Group Home Can pt return to current living arrangement?: Yes Admission Status: Involuntary Is patient capable of signing voluntary admission?: No Transfer from: Group Home Referral Source: Other  Risk to self Suicidal Ideation: Yes-Currently Present Suicidal Intent: Yes-Currently Present Is patient at risk for suicide?: Yes Suicidal Plan?: Yes-Currently Present Specify Current Suicidal Plan: to cut wrist or od on her pills at group home Access to Means: Yes Specify Access to Suicidal Means: sharps, reports pills are not locked up at group home What has been your use of drugs/alcohol within the last 12 months?: na Other Self Harm Risks: yes (cut wrist this week) Triggers for Past Attempts: Other personal contacts (others at group home) Intentional Self Injurious Behavior: Cutting Comment - Self Injurious Behavior: hx opf cutting Factors that decrease suicide risk: Positive therapeutic relationships Family Suicide History: No Recent stressful life event(s): Conflict (Comment) (new group home which she does not like) Persecutory voices/beliefs?: No Depression: Yes Depression Symptoms: Loss of interest in usual pleasures;Feeling worthless/self pity;Tearfulness;Isolating Substance abuse history and/or treatment for substance abuse?: No Suicide prevention  information given to non-admitted patients: Not applicable  Risk to Others Homicidal Ideation: No Thoughts of Harm to Others: No Current Homicidal Intent: No Current Homicidal Plan: No Access to Homicidal Means: No Identified Victim: na History of harm to others?: No Assessment of Violence: None Noted Violent Behavior Description: na Does patient have access to weapons?: No Criminal Charges Pending?: No Does patient have a court date: No  Mental Status Report Appear/Hygiene: Improved Eye Contact: Good Motor Activity: Freedom of movement Speech: Logical/coherent Level of Consciousness: Alert Mood: Depressed;Despair;Helpless;Sad;Worthless, low self-esteem Affect: Appropriate to circumstance;Depressed;Sad Anxiety Level: Minimal Thought Processes: Coherent;Relevant Judgement: Impaired Orientation: Person;Place;Time;Situation Obsessive Compulsive Thoughts/Behaviors: None  Cognitive Functioning Concentration: Normal Memory: Recent Intact;Remote Intact IQ: Average Insight: Poor Impulse Control: Poor Appetite: Good Sleep: No Change Total Hours of Sleep: 8  Vegetative Symptoms: None  Prior Inpatient/Outpatient Therapy Prior Therapy: Inpatient Prior Therapy Dates: UNK Prior Therapy Facilty/Provider(s): [facility] Reason for Treatment: SELF-MULITATION            Values / Beliefs Cultural Requests During Hospitalization: None Spiritual Requests During Hospitalization: None        Additional Information 1:1 In Past 12 Months?: No CIRT Risk: No Elopement Risk: No Does patient have medical clearance?: Yes     Disposition:  Disposition Disposition of Patient: Inpatient treatment program Type of inpatient treatment program: Adult  On Site Evaluation by:   Reviewed with Physician:     Hattie Perch Winford 09/13/2011 11:34 AM

## 2011-09-13 NOTE — ED Provider Notes (Cosign Needed Addendum)
2:49pm Patient still reports feeling bad about living situations. States she gets bored sitting around and then begins to hurt herself. Pt states she would prefer working with animals or doing yard work. Ella from [facility] Team is still working on finding a place to patient to be admitted.   PT has superficial short linear abrasion on her left volar wrist, not infected. PT is alert, she has good eye contact and doesn't appear to be flat.   I personally performed the services described in this documentation, which was scribed in my presence. The recorded information has been reviewed and considered. Devoria Albe, MD, FACEP   Ward Givens, MD 09/13/11 1456  Ward Givens, MD 09/13/11 [phone]

## 2011-09-13 NOTE — ED Notes (Signed)
Pt was taken to x-ray by sitter. Pt's room was thoroughly searched by staff members. Trash can in bathroom was also searched for the crayon. Crayon was not found. Lights were turned on and left on. Also pt's blanket was taken away from here so she could be closely monitored.

## 2011-09-13 NOTE — ED Notes (Signed)
Sitter at [facility]. Pt asleep in room. resp even/nonlabored.

## 2011-09-13 NOTE — ED Notes (Signed)
Dr. Hyacinth Meeker spoke with Samson Frederic Miller([facility]). Earlier.

## 2011-09-13 NOTE — ED Notes (Signed)
Pt was given lunch tray.  

## 2011-09-14 MED ORDER — ZIPRASIDONE MESYLATE 20 MG IM SOLR
10.0000 mg | Freq: Once | INTRAMUSCULAR | Status: AC
  Administered 2011-09-14: 10 mg via INTRAMUSCULAR
  Filled 2011-09-14: qty 20

## 2011-09-14 MED ORDER — TETANUS-DIPHTH-ACELL PERTUSSIS 5-2.5-18.5 LF-MCG/0.5 IM SUSP
0.5000 mL | Freq: Once | INTRAMUSCULAR | Status: AC
  Administered 2011-09-14: 0.5 mL via INTRAMUSCULAR
  Filled 2011-09-14: qty 0.5

## 2011-09-14 NOTE — ED Notes (Signed)
Pt became agitated, sitter notified RN of pt standing in corner banging head on wall, Cynthia Phelps. RN attempted to calm pt when pt became aggressive swearing and swinging at rn. Security called to room 17. 4 RNs and security restrained pt. Pt bit self in left hand Removing a dime size piece of flesh. EDP aware. [place] sheriff in ED on another case  called to assist with PT.  Pt calm after speaking with sheriff. Pt apologized to staff.

## 2011-09-14 NOTE — ED Notes (Signed)
Patient to shower at this time with Cynthia Phelps, ED tech supervising. Patient in NAD. Will assess when patient has had shower.

## 2011-09-14 NOTE — ED Notes (Signed)
Patient showered under supervision of Pattricia Boss, ED tech. Oral hygiene completed and patient offered restroom. Denies any other needs.   Patient ate 100% of breakfast tray. Sitter remains at bedside with patient. Patient currently watching TV. NAD noted.

## 2011-09-14 NOTE — ED Provider Notes (Signed)
Pt acting out, bit her left hand, small abrasion noted Will order geodon BP 148/113  Pulse 76  Temp(Src) 98.3 F (36.8 C) (Oral)  Resp 20  SpO2 100% Labs reviewed Awaiting placement, likely [facility]  Joya Gaskins, MD 09/14/11 1750

## 2011-09-14 NOTE — ED Notes (Signed)
Bite mark on top off left hand cleansed with peroxide with dressing applied.

## 2011-09-14 NOTE — ED Notes (Signed)
Asleep in bed, resp even, sitter at bedside.

## 2011-09-14 NOTE — ED Notes (Signed)
Pt asleep in bed, resp even.  Sitter at bedside.

## 2011-09-14 NOTE — ED Notes (Signed)
Sitter remains at bedside. Pt asleep

## 2011-09-15 NOTE — ED Notes (Signed)
Pt eating supper. req second tray. Pt cont to wait for inpt psyc bed. Pt has been quiet and cooperative all day. Sitter remains at bedside

## 2011-09-15 NOTE — ED Notes (Signed)
Sitter with pt. Breakfast served. Pt waiting for inpt at a mental health facility

## 2011-09-15 NOTE — ED Provider Notes (Signed)
6:20 AM Patient sleeping. No issues overnight. Awaiting space at [facility].   Hanley Seamen, MD 09/15/11 [phone]

## 2011-09-15 NOTE — BH Assessment (Signed)
Assessment Note   Cynthia Phelps is an 20 y.o. female. Remains in;the ED awaiting acceptance in a psychiatric hospital. She was  seen earlier to day by tele-psych and remains IVC, She is suicidal and cannot  Contract for safety. She continues to make remarks and vague suggestions regarding small object that she might try to put into her bladder. She remains calm and has not caused any disturbance Axis II: Borderline Personality Dis. Axis III:  Past Medical History  Diagnosis Date  . Asthma   . Hyperlipidemia, acquired   . Borderline personality disorder   . Anxiety   . Mood disorder   . Depression    Axis IV: other psychosocial or environmental problems Axis V: 11-20 some danger of hurting self or others possible OR occasionally fails to maintain minimal personal hygiene OR gross impairment in communication Past Medical History:  Past Medical History  Diagnosis Date  . Asthma   . Hyperlipidemia, acquired   . Borderline personality disorder   . Anxiety   . Mood disorder   . Depression     Past Surgical History  Procedure Date  . Open cystotomy   . Foreign body removal 08/29/2011    Procedure: FOREIGN BODY REMOVAL ADULT;  Surgeon: Antony Haste, MD;  Location: [facility];  Service: Urology;  Laterality: N/A;  . Cystoscopy 08/29/2011    Procedure: CYSTOSCOPY;  Surgeon: Antony Haste, MD;  Location: [facility];  Service: Urology;  Laterality: N/A;  with right retrograde and cystogram  . Bladder surgery     Family History: History reviewed. No pertinent family history.  Social History:  reports that she has been smoking Cigarettes.  She has been smoking about 1 pack per day. She has never used smokeless tobacco. She reports that she does not drink alcohol or use illicit drugs.  Allergies:  Allergies  Allergen Reactions  . Haldol (Haloperidol Decanoate) Shortness Of Breath and Other (See Comments)    Freezes up and tongue sticks out ,Unable to move arms and legs,  muscle stiffness   . Latex     Sensitivity, not severe  . Doxycycline Itching, Rash and Other (See Comments)    Unable to move arms and legs, muscle stiffness  . Tetracyclines & Related Itching, Rash and Other (See Comments)    Unable to move arms and legs, muscle stiffness    Home Medications:  Medications Prior to Admission  Medication Dose Route Frequency Provider Last Rate Last Dose  . acetaminophen (TYLENOL) tablet 650 mg  650 mg Oral Q4H PRN Vida Roller, MD      . alum & mag hydroxide-simeth (MAALOX/MYLANTA) 200-200-20 MG/5ML suspension 30 mL  30 mL Oral PRN Vida Roller, MD      . citalopram (CELEXA) tablet 20 mg  20 mg Oral Daily Vida Roller, MD   20 mg at 09/15/11 1103  . ibuprofen (ADVIL,MOTRIN) tablet 600 mg  600 mg Oral Q8H PRN Vida Roller, MD      . LORazepam (ATIVAN) tablet 1 mg  1 mg Oral Q8H PRN Vida Roller, MD   1 mg at 09/13/11 1555  . nicotine (NICODERM CQ - dosed in mg/24 hours) patch 21 mg  21 mg Transdermal Daily Vida Roller, MD   21 mg at 09/15/11 1108  . ondansetron (ZOFRAN) tablet 4 mg  4 mg Oral Q8H PRN Vida Roller, MD      . TDaP Leda Min) injection 0.5 mL  0.5 mL Intramuscular Once Dorinda Hill  Forestine Chute, MD   0.5 mL at 09/14/11 1748  . ziprasidone (GEODON) capsule 60 mg  60 mg Oral BID WC Vida Roller, MD   60 mg at 09/15/11 1049  . ziprasidone (GEODON) injection 10 mg  10 mg Intramuscular Once Joya Gaskins, MD   10 mg at 09/14/11 1748  . zolpidem (AMBIEN) tablet 5 mg  5 mg Oral QHS PRN Vida Roller, MD   5 mg at 09/14/11 2153  . DISCONTD: acetaminophen (TYLENOL) tablet 650 mg  650 mg Oral Q4H PRN Nicoletta Dress. Colon Branch, MD      . DISCONTD: citalopram (CELEXA) tablet 20 mg  20 mg Oral Daily Nicoletta Dress. Colon Branch, MD   20 mg at 09/11/11 0902  . DISCONTD: docusate sodium (COLACE) capsule 100 mg  100 mg Oral BID Nicoletta Dress. Colon Branch, MD   100 mg at 09/11/11 0902  . DISCONTD: ibuprofen (ADVIL,MOTRIN) tablet 600 mg  600 mg Oral Q8H PRN Nicoletta Dress. Colon Branch, MD        . DISCONTD: nicotine (NICODERM CQ - dosed in mg/24 hours) patch 21 mg  21 mg Transdermal Daily Nicoletta Dress. Colon Branch, MD   21 mg at 09/11/11 0901  . DISCONTD: ondansetron (ZOFRAN) tablet 4 mg  4 mg Oral Q8H PRN Nicoletta Dress. Colon Branch, MD      . DISCONTD: simvastatin (ZOCOR) tablet 20 mg  20 mg Oral Daily Nicoletta Dress. Colon Branch, MD   20 mg at 09/11/11 0901  . DISCONTD: tiotropium (SPIRIVA) inhalation capsule 18 mcg  18 mcg Inhalation Daily Nicoletta Dress. Colon Branch, MD   18 mcg at 09/11/11 0921  . DISCONTD: traZODone (DESYREL) tablet 100 mg  100 mg Oral BID Nicoletta Dress. Colon Branch, MD   100 mg at 09/11/11 0901  . DISCONTD: ziprasidone (GEODON) capsule 60 mg  60 mg Oral BID WC Nicoletta Dress. Colon Branch, MD   60 mg at 09/11/11 0902   Medications Prior to Admission  Medication Sig Dispense Refill  . albuterol (PROVENTIL HFA;VENTOLIN HFA) 108 (90 BASE) MCG/ACT inhaler Inhale 2 puffs into the lungs every 4 (four) hours as needed. For shortness of breath       . aspirin EC 81 MG tablet Take 81 mg by mouth daily.        . citalopram (CELEXA) 20 MG tablet Take 1 tablet (20 mg total) by mouth daily.  30 tablet  0  . cloZAPine (CLOZARIL) 100 MG tablet Take 200 mg by mouth daily.        Marland Kitchen omega-3 acid ethyl esters (LOVAZA) 1 G capsule Take 1 g by mouth 2 (two) times daily.        . polyethylene glycol powder (GLYCOLAX/MIRALAX) powder Take 17 g by mouth daily.        . simvastatin (ZOCOR) 20 MG tablet Take 20 mg by mouth daily.        Marland Kitchen tiotropium (SPIRIVA) 18 MCG inhalation capsule Place 18 mcg into inhaler and inhale daily.        . traZODone (DESYREL) 100 MG tablet Take 100-200 mg by mouth at bedtime.       . ziprasidone (GEODON) 60 MG capsule Take 60 mg by mouth 2 (two) times daily with a meal.        . medroxyPROGESTERone (DEPO-PROVERA) 150 MG/ML injection Inject 1 mL (150 mg total) into the muscle every 3 (three) months.      . sulfamethoxazole-trimethoprim (BACTRIM DS) 800-160 MG per tablet Take 1 tablet by mouth daily.  9 tablet  0    OB/GYN  Status:  No LMP recorded. Patient has had an injection.  General Assessment Data Assessment Number: 2  Living Arrangements: Group Home Can pt return to current living arrangement?: Yes Admission Status: Involuntary Is patient capable of signing voluntary admission?: No Transfer from: Acute Hospital Referral Source: MD  Risk to self Suicidal Ideation: Yes-Currently Present Suicidal Intent: Yes-Currently Present Is patient at risk for suicide?: Yes Suicidal Plan?: Yes-Currently Present Specify Current Suicidal Plan: cut wrist Access to Means: Yes Specify Access to Suicidal Means: can obtain sharps What has been your use of drugs/alcohol within the last 12 months?: none Other Self Harm Risks: puts objects in bladder Triggers for Past Attempts: Other personal contacts Intentional Self Injurious Behavior: Damaging;Cutting Comment - Self Injurious Behavior: hx of cutting, placing object in bladder Factors that decrease suicide risk: Positive therapeutic relationships Family Suicide History: No Recent stressful life event(s): Conflict (Comment) Persecutory voices/beliefs?: No Depression: Yes Depression Symptoms: Loss of interest in usual pleasures;Feeling angry/irritable;Tearfulness;Isolating Substance abuse history and/or treatment for substance abuse?: No Suicide prevention information given to non-admitted patients: Not applicable  Risk to Others Homicidal Ideation: No Thoughts of Harm to Others: No Current Homicidal Intent: No Current Homicidal Plan: No Access to Homicidal Means: No Identified Victim: na History of harm to others?: No Assessment of Violence: None Noted Violent Behavior Description: na Does patient have access to weapons?: No Criminal Charges Pending?: No Does patient have a court date: No  Mental Status Report Appear/Hygiene: Improved Eye Contact: Fair Motor Activity: Unremarkable Speech: Logical/coherent Level of Consciousness: Alert Mood:  Depressed;Helpless;Worthless, low self-esteem Affect: Appropriate to circumstance;Depressed;Sad Anxiety Level: Minimal Thought Processes: Coherent Judgement: Unimpaired Orientation: Person;Place;Time;Situation Obsessive Compulsive Thoughts/Behaviors: Minimal  Cognitive Functioning Concentration: Normal Memory: Recent Intact;Remote Intact IQ: Average Insight: Poor Impulse Control: Poor Appetite: Good Weight Loss: 0  Weight Gain: 0  Sleep: No Change Total Hours of Sleep: 8  Vegetative Symptoms: None  Prior Inpatient/Outpatient Therapy Prior Therapy: Inpatient Prior Therapy Dates: UNK Prior Therapy Facilty/Provider(s): [facility] Reason for Treatment: SELF-MULITATION            Values / Beliefs Cultural Requests During Hospitalization: None Spiritual Requests During Hospitalization: None        Additional Information 1:1 In Past 12 Months?: No CIRT Risk: No Elopement Risk: No Does patient have medical clearance?: Yes     Disposition:  Disposition Disposition of Patient: Inpatient treatment program Type of inpatient treatment program: Adult The  On Site Evaluation by:   Reviewed with Physician:   The patient was referred to and declined by the following hospitals: [facility] Dr Alvie Heidelberg, needs long term treatment, [facility], no beds; [facility], Lupita Leash- patient acuity, [facility]- Denied, [facility] , declined no beds, [facility] Dr Dalbert Batman, [facility] Dr. Ilean Skill. Patient was referred to [facility], after obtaining authorization from  [facility] . Authorization was given for 7 days. Information was faxed to the state hospital, Patient pending at [facility]. Dr Oletta Lamas agrees with disposition.  Jake Shark [facility] 09/15/2011 7:05 PM

## 2011-09-15 NOTE — ED Notes (Signed)
Pt up to shower. meds req from pharm. Pt to have telapsych conference later

## 2011-09-15 NOTE — ED Notes (Signed)
Pt upset because the pt across the hall from her got a bed before she did. Pt made aware that Tommy,  With [facility] was still working on getting her placed.

## 2011-09-15 NOTE — ED Notes (Signed)
Pt consulting telepsych at this time.

## 2011-09-15 NOTE — ED Notes (Signed)
Pt up to restroom. Steady gait noted. Pt asking for new band-aid on lt hand.

## 2011-09-15 NOTE — ED Notes (Signed)
Per pt called to cancel her urology appt at this time. Cynthia Phelps

## 2011-09-16 NOTE — ED Notes (Signed)
Pt cont to wait for inpt bed at a behavioral health center

## 2011-09-16 NOTE — ED Notes (Signed)
Pharm called to bring am meds

## 2011-09-16 NOTE — Progress Notes (Signed)
[id number] Patient with SI, h/o self mutilation. Awaiting acceptance at [facility]. IVC and holding orders on chart.

## 2011-09-16 NOTE — ED Notes (Signed)
Pt req something for here nerves

## 2011-09-16 NOTE — ED Notes (Signed)
Pt alert, cooperative and denies needs at present time.

## 2011-09-16 NOTE — BH Assessment (Signed)
The patient remains suicidal and unable to contract for safety. She continues to talk about cutting her wrists. Last night the petition was denied by [facility], The staff as being completed wrong, with signatures in ;the wrong place. This morning this was again discussed with [facility] staff. Spoke with Maximino Sarin of [employer]. She  Stated that she would redo the Findings  And Custody order and have it delivered by deputies. Late this afternoon the deputies arrived with the paperwork;  And spoke with admitting a [facility] to assure that the forms were now correct.Paperwork was verified as correct and faxed to [facility]. Patient is still pending at the hospital. Status could not be verified as admitting staff was in report.

## 2011-09-16 NOTE — ED Notes (Signed)
Pt up for shower. Assisted by ed staff

## 2011-09-16 NOTE — ED Notes (Signed)
Notified by Samson Frederic from ACT team that patient has been placed on waiting list at [facility], but does not have a room assignment.

## 2011-09-16 NOTE — Progress Notes (Signed)
1500 Patient here with suicidal ideation, h/o self mutilation. Requested and completed telepsych evaluation. Recommendation was that patient be placed in a psych facility. 1700 ACT has been working on placement. Await work from [employer].

## 2011-09-16 NOTE — BH Assessment (Addendum)
Assessment Note   Cynthia Phelps is an 20 y.o. female.  09/16/11---1956--    DR ZAMMIT REQUESTED A FOLLOW UP ON PT. CALLED [facility] AND SPOKE WITH BARBARA DAVIS WHO CONFIRMED PT HAS BEEN PLACED ON THE WAIT LIST BUT IS UNSURE WHEN A BED WILL BECOME AVAILABLE. INFORMED  DR ZAMMIT.  Axis I: Major Depression, Recurrent severe Axis II: Borderline Personality Dis. Axis III:  Past Medical History  Diagnosis Date  . Asthma   . Hyperlipidemia, acquired   . Borderline personality disorder   . Anxiety   . Mood disorder   . Depression    Axis IV: other psychosocial or environmental problems Axis V: 11-20 some danger of hurting self or others possible OR occasionally fails to maintain minimal personal hygiene OR gross impairment in communication  Past Medical History:  Past Medical History  Diagnosis Date  . Asthma   . Hyperlipidemia, acquired   . Borderline personality disorder   . Anxiety   . Mood disorder   . Depression     Past Surgical History  Procedure Date  . Open cystotomy   . Foreign body removal 08/29/2011    Procedure: FOREIGN BODY REMOVAL ADULT;  Surgeon: Antony Haste, MD;  Location: [facility];  Service: Urology;  Laterality: N/A;  . Cystoscopy 08/29/2011    Procedure: CYSTOSCOPY;  Surgeon: Antony Haste, MD;  Location: [facility];  Service: Urology;  Laterality: N/A;  with right retrograde and cystogram  . Bladder surgery     Family History: History reviewed. No pertinent family history.  Social History:  reports that she has been smoking Cigarettes.  She has been smoking about 1 pack per day. She has never used smokeless tobacco. She reports that she does not drink alcohol or use illicit drugs.  Allergies:  Allergies  Allergen Reactions  . Haldol (Haloperidol Decanoate) Shortness Of Breath and Other (See Comments)    Freezes up and tongue sticks out ,Unable to move arms and legs, muscle stiffness   . Latex     Sensitivity, not severe  . Doxycycline Itching,  Rash and Other (See Comments)    Unable to move arms and legs, muscle stiffness  . Tetracyclines & Related Itching, Rash and Other (See Comments)    Unable to move arms and legs, muscle stiffness    Home Medications:  Medications Prior to Admission  Medication Dose Route Frequency Provider Last Rate Last Dose  . acetaminophen (TYLENOL) tablet 650 mg  650 mg Oral Q4H PRN Vida Roller, MD      . alum & mag hydroxide-simeth (MAALOX/MYLANTA) 200-200-20 MG/5ML suspension 30 mL  30 mL Oral PRN Vida Roller, MD      . citalopram (CELEXA) tablet 20 mg  20 mg Oral Daily Vida Roller, MD   20 mg at 09/16/11 1050  . ibuprofen (ADVIL,MOTRIN) tablet 600 mg  600 mg Oral Q8H PRN Vida Roller, MD      . LORazepam (ATIVAN) tablet 1 mg  1 mg Oral Q8H PRN Vida Roller, MD   1 mg at 09/16/11 1615  . nicotine (NICODERM CQ - dosed in mg/24 hours) patch 21 mg  21 mg Transdermal Daily Vida Roller, MD   21 mg at 09/16/11 1053  . ondansetron (ZOFRAN) tablet 4 mg  4 mg Oral Q8H PRN Vida Roller, MD      . TDaP Leda Min) injection 0.5 mL  0.5 mL Intramuscular Once Joya Gaskins, MD   0.5 mL at  09/14/11 1748  . ziprasidone (GEODON) capsule 60 mg  60 mg Oral BID WC Vida Roller, MD   60 mg at 09/16/11 1050  . ziprasidone (GEODON) injection 10 mg  10 mg Intramuscular Once Joya Gaskins, MD   10 mg at 09/14/11 1748  . zolpidem (AMBIEN) tablet 5 mg  5 mg Oral QHS PRN Vida Roller, MD   5 mg at 09/14/11 2153  . DISCONTD: acetaminophen (TYLENOL) tablet 650 mg  650 mg Oral Q4H PRN Nicoletta Dress. Colon Branch, MD      . DISCONTD: citalopram (CELEXA) tablet 20 mg  20 mg Oral Daily Nicoletta Dress. Colon Branch, MD   20 mg at 09/11/11 0902  . DISCONTD: docusate sodium (COLACE) capsule 100 mg  100 mg Oral BID Nicoletta Dress. Colon Branch, MD   100 mg at 09/11/11 0902  . DISCONTD: ibuprofen (ADVIL,MOTRIN) tablet 600 mg  600 mg Oral Q8H PRN Nicoletta Dress. Colon Branch, MD      . DISCONTD: nicotine (NICODERM CQ - dosed in mg/24 hours) patch 21 mg  21 mg  Transdermal Daily Nicoletta Dress. Colon Branch, MD   21 mg at 09/11/11 0901  . DISCONTD: ondansetron (ZOFRAN) tablet 4 mg  4 mg Oral Q8H PRN Nicoletta Dress. Colon Branch, MD      . DISCONTD: simvastatin (ZOCOR) tablet 20 mg  20 mg Oral Daily Nicoletta Dress. Colon Branch, MD   20 mg at 09/11/11 0901  . DISCONTD: tiotropium (SPIRIVA) inhalation capsule 18 mcg  18 mcg Inhalation Daily Nicoletta Dress. Colon Branch, MD   18 mcg at 09/11/11 0921  . DISCONTD: traZODone (DESYREL) tablet 100 mg  100 mg Oral BID Nicoletta Dress. Colon Branch, MD   100 mg at 09/11/11 0901  . DISCONTD: ziprasidone (GEODON) capsule 60 mg  60 mg Oral BID WC Nicoletta Dress. Colon Branch, MD   60 mg at 09/11/11 0902   Medications Prior to Admission  Medication Sig Dispense Refill  . albuterol (PROVENTIL HFA;VENTOLIN HFA) 108 (90 BASE) MCG/ACT inhaler Inhale 2 puffs into the lungs every 4 (four) hours as needed. For shortness of breath       . aspirin EC 81 MG tablet Take 81 mg by mouth daily.        . citalopram (CELEXA) 20 MG tablet Take 1 tablet (20 mg total) by mouth daily.  30 tablet  0  . cloZAPine (CLOZARIL) 100 MG tablet Take 200 mg by mouth daily.        Marland Kitchen omega-3 acid ethyl esters (LOVAZA) 1 G capsule Take 1 g by mouth 2 (two) times daily.        . polyethylene glycol powder (GLYCOLAX/MIRALAX) powder Take 17 g by mouth daily.        . simvastatin (ZOCOR) 20 MG tablet Take 20 mg by mouth daily.        Marland Kitchen tiotropium (SPIRIVA) 18 MCG inhalation capsule Place 18 mcg into inhaler and inhale daily.        . traZODone (DESYREL) 100 MG tablet Take 100-200 mg by mouth at bedtime.       . ziprasidone (GEODON) 60 MG capsule Take 60 mg by mouth 2 (two) times daily with a meal.        . medroxyPROGESTERone (DEPO-PROVERA) 150 MG/ML injection Inject 1 mL (150 mg total) into the muscle every 3 (three) months.      . sulfamethoxazole-trimethoprim (BACTRIM DS) 800-160 MG per tablet Take 1 tablet by mouth daily.  9 tablet  0    OB/GYN Status:  No LMP  recorded. Patient has had an injection.  General Assessment  Data Assessment Number: 2  Living Arrangements: Group Home Can pt return to current living arrangement?: Yes Admission Status: Involuntary Is patient capable of signing voluntary admission?: No Transfer from: Acute Hospital Referral Source: MD  Risk to self Suicidal Ideation: Yes-Currently Present Suicidal Intent: Yes-Currently Present Is patient at risk for suicide?: Yes Suicidal Plan?: Yes-Currently Present Specify Current Suicidal Plan: cut wrist Access to Means: Yes Specify Access to Suicidal Means: can obtain sharps What has been your use of drugs/alcohol within the last 12 months?: none Other Self Harm Risks: puts objects in bladder Triggers for Past Attempts: Other personal contacts Intentional Self Injurious Behavior: Damaging;Cutting Comment - Self Injurious Behavior: hx of cutting, placing object in bladder Factors that decrease suicide risk: Positive therapeutic relationships Family Suicide History: No Recent stressful life event(s): Conflict (Comment) Persecutory voices/beliefs?: No Depression: Yes Depression Symptoms: Loss of interest in usual pleasures;Feeling angry/irritable;Tearfulness;Isolating Substance abuse history and/or treatment for substance abuse?: No Suicide prevention information given to non-admitted patients: Not applicable  Risk to Others Homicidal Ideation: No Thoughts of Harm to Others: No Current Homicidal Intent: No Current Homicidal Plan: No Access to Homicidal Means: No Identified Victim: na History of harm to others?: No Assessment of Violence: None Noted Violent Behavior Description: na Does patient have access to weapons?: No Criminal Charges Pending?: No Does patient have a court date: No  Mental Status Report Appear/Hygiene: Improved Eye Contact: Fair Motor Activity: Unremarkable Speech: Logical/coherent Level of Consciousness: Alert Mood: Depressed;Helpless;Worthless, low self-esteem Affect: Appropriate to  circumstance;Depressed;Sad Anxiety Level: Minimal Thought Processes: Coherent Judgement: Unimpaired Orientation: Person;Place;Time;Situation Obsessive Compulsive Thoughts/Behaviors: Minimal  Cognitive Functioning Concentration: Normal Memory: Recent Intact;Remote Intact IQ: Average Insight: Poor Impulse Control: Poor Appetite: Good Weight Loss: 0  Weight Gain: 0  Sleep: No Change Total Hours of Sleep: 8  Vegetative Symptoms: None  Prior Inpatient/Outpatient Therapy Prior Therapy: Inpatient Prior Therapy Dates: UNK Prior Therapy Facilty/Provider(s): [facility] Reason for Treatment: SELF-MULITATION            Values / Beliefs Cultural Requests During Hospitalization: None Spiritual Requests During Hospitalization: None        Additional Information 1:1 In Past 12 Months?: No CIRT Risk: No Elopement Risk: No Does patient have medical clearance?: Yes     Disposition:  Disposition Disposition of Patient: Inpatient treatment program Type of inpatient treatment program: Adult  On Site Evaluation by:   Reviewed with Physician:     Hattie Perch Winford 09/16/2011 8:35 PM

## 2011-09-17 NOTE — ED Notes (Signed)
Patient has slept since 11pm when I assumed care.

## 2011-09-17 NOTE — ED Notes (Signed)
Pt sleeping at present. Sitter at bedside.

## 2011-09-18 MED ORDER — LORAZEPAM 1 MG PO TABS
1.0000 mg | ORAL_TABLET | Freq: Once | ORAL | Status: AC
  Administered 2011-09-18: 1 mg via ORAL

## 2011-09-18 MED ORDER — BACITRACIN 500 UNIT/GM EX OINT
1.0000 "application " | TOPICAL_OINTMENT | Freq: Every day | CUTANEOUS | Status: DC
  Administered 2011-09-18 – 2011-09-22 (×6): 1 via TOPICAL
  Filled 2011-09-18 (×2): qty 0.9

## 2011-09-18 MED ORDER — DOUBLE ANTIBIOTIC 500-10000 UNIT/GM EX OINT: TOPICAL_OINTMENT | Freq: Once | CUTANEOUS | Status: DC

## 2011-09-18 NOTE — ED Notes (Signed)
Pt sleeping but arouses easily to verbal stimuli. Shower and toileting offered but patient states that she is going to wait a little while.

## 2011-09-18 NOTE — ED Notes (Signed)
Cough audible, pt states feels chilled and achey

## 2011-09-18 NOTE — ED Notes (Signed)
Pt's cough becoming more frequent, Dr Bebe Shaggy aware.  Tylenol given secondary to pt's c/o chills and generalized discomfort. VS WNL.   BBS clear and equal.

## 2011-09-18 NOTE — BH Assessment (Signed)
Assessment Note   Cynthia Phelps is an 20 y.o. female.  The patient continues to wait for acceptance at [facility]. She was declined by area psych units with comments that she need long term treatment. Today she remains suicidal. She does not feel she can return  To the group home and be safe  She does not complain of hallucinations. She may   be delusional but is hard to discern.She  remains on petition.   Axis I: Mood Disorder NOS Axis II: Borderline Personality Dis. Axis III:  Past Medical History  Diagnosis Date  . Asthma   . Hyperlipidemia, acquired   . Borderline personality disorder   . Anxiety   . Mood disorder   . Depression    Axis IV: problems related to social environment and problems with primary support group Axis V: 21-30 behavior considerably influenced by delusions or hallucinations OR serious impairment in judgment, communication OR inability to function in almost all areas  Past Medical History:  Past Medical History  Diagnosis Date  . Asthma   . Hyperlipidemia, acquired   . Borderline personality disorder   . Anxiety   . Mood disorder   . Depression     Past Surgical History  Procedure Date  . Open cystotomy   . Foreign body removal 08/29/2011    Procedure: FOREIGN BODY REMOVAL ADULT;  Surgeon: Antony Haste, MD;  Location: [facility];  Service: Urology;  Laterality: N/A;  . Cystoscopy 08/29/2011    Procedure: CYSTOSCOPY;  Surgeon: Antony Haste, MD;  Location: [facility];  Service: Urology;  Laterality: N/A;  with right retrograde and cystogram  . Bladder surgery     Family History: History reviewed. No pertinent family history.  Social History:  reports that she has been smoking Cigarettes.  She has been smoking about 1 pack per day. She has never used smokeless tobacco. She reports that she does not drink alcohol or use illicit drugs.  Additional Social History:    Allergies:  Allergies  Allergen Reactions  . Haldol (Haloperidol Decanoate)  Shortness Of Breath and Other (See Comments)    Freezes up and tongue sticks out ,Unable to move arms and legs, muscle stiffness   . Latex     Sensitivity, not severe  . Doxycycline Itching, Rash and Other (See Comments)    Unable to move arms and legs, muscle stiffness  . Tetracyclines & Related Itching, Rash and Other (See Comments)    Unable to move arms and legs, muscle stiffness    Home Medications:  Medications Prior to Admission  Medication Dose Route Frequency Provider Last Rate Last Dose  . acetaminophen (TYLENOL) tablet 650 mg  650 mg Oral Q4H PRN Vida Roller, MD      . alum & mag hydroxide-simeth (MAALOX/MYLANTA) 200-200-20 MG/5ML suspension 30 mL  30 mL Oral PRN Vida Roller, MD      . bacitracin ointment 1 application  1 application Topical Daily Glynn Octave, MD   1 application at 09/18/11 1004  . citalopram (CELEXA) tablet 20 mg  20 mg Oral Daily Vida Roller, MD   20 mg at 09/18/11 1003  . ibuprofen (ADVIL,MOTRIN) tablet 600 mg  600 mg Oral Q8H PRN Vida Roller, MD      . LORazepam (ATIVAN) tablet 1 mg  1 mg Oral Q8H PRN Vida Roller, MD   1 mg at 09/16/11 1615  . LORazepam (ATIVAN) tablet 1 mg  1 mg Oral Once Glynn Octave, MD  1 mg at 09/18/11 0431  . nicotine (NICODERM CQ - dosed in mg/24 hours) patch 21 mg  21 mg Transdermal Daily Vida Roller, MD   21 mg at 09/18/11 1003  . ondansetron (ZOFRAN) tablet 4 mg  4 mg Oral Q8H PRN Vida Roller, MD      . Lady Gary Leda Min) injection 0.5 mL  0.5 mL Intramuscular Once Joya Gaskins, MD   0.5 mL at 09/14/11 1748  . ziprasidone (GEODON) capsule 60 mg  60 mg Oral BID WC Vida Roller, MD   60 mg at 09/18/11 0855  . ziprasidone (GEODON) injection 10 mg  10 mg Intramuscular Once Joya Gaskins, MD   10 mg at 09/14/11 1748  . zolpidem (AMBIEN) tablet 5 mg  5 mg Oral QHS PRN Vida Roller, MD   5 mg at 09/16/11 2255  . DISCONTD: acetaminophen (TYLENOL) tablet 650 mg  650 mg Oral Q4H PRN Nicoletta Dress. Colon Branch, MD       . DISCONTD: citalopram (CELEXA) tablet 20 mg  20 mg Oral Daily Nicoletta Dress. Colon Branch, MD   20 mg at 09/11/11 0902  . DISCONTD: docusate sodium (COLACE) capsule 100 mg  100 mg Oral BID Nicoletta Dress. Colon Branch, MD   100 mg at 09/11/11 0902  . DISCONTD: ibuprofen (ADVIL,MOTRIN) tablet 600 mg  600 mg Oral Q8H PRN Nicoletta Dress. Colon Branch, MD      . DISCONTD: nicotine (NICODERM CQ - dosed in mg/24 hours) patch 21 mg  21 mg Transdermal Daily Nicoletta Dress. Colon Branch, MD   21 mg at 09/11/11 0901  . DISCONTD: ondansetron (ZOFRAN) tablet 4 mg  4 mg Oral Q8H PRN Nicoletta Dress. Colon Branch, MD      . DISCONTD: polymixin-polysporin (POLYSPORIN) ointment   Topical Once Glynn Octave, MD      . DISCONTD: simvastatin (ZOCOR) tablet 20 mg  20 mg Oral Daily Nicoletta Dress. Colon Branch, MD   20 mg at 09/11/11 0901  . DISCONTD: tiotropium (SPIRIVA) inhalation capsule 18 mcg  18 mcg Inhalation Daily Nicoletta Dress. Colon Branch, MD   18 mcg at 09/11/11 0921  . DISCONTD: traZODone (DESYREL) tablet 100 mg  100 mg Oral BID Nicoletta Dress. Colon Branch, MD   100 mg at 09/11/11 0901  . DISCONTD: ziprasidone (GEODON) capsule 60 mg  60 mg Oral BID WC Nicoletta Dress. Colon Branch, MD   60 mg at 09/11/11 0902   Medications Prior to Admission  Medication Sig Dispense Refill  . albuterol (PROVENTIL HFA;VENTOLIN HFA) 108 (90 BASE) MCG/ACT inhaler Inhale 2 puffs into the lungs every 4 (four) hours as needed. For shortness of breath       . aspirin EC 81 MG tablet Take 81 mg by mouth daily.        . citalopram (CELEXA) 20 MG tablet Take 1 tablet (20 mg total) by mouth daily.  30 tablet  0  . cloZAPine (CLOZARIL) 100 MG tablet Take 200 mg by mouth daily.        Marland Kitchen omega-3 acid ethyl esters (LOVAZA) 1 G capsule Take 1 g by mouth 2 (two) times daily.        . polyethylene glycol powder (GLYCOLAX/MIRALAX) powder Take 17 g by mouth daily.        . simvastatin (ZOCOR) 20 MG tablet Take 20 mg by mouth daily.        Marland Kitchen tiotropium (SPIRIVA) 18 MCG inhalation capsule Place 18 mcg into inhaler and inhale daily.        Marland Kitchen  traZODone (DESYREL) 100 MG tablet Take 100-200 mg by mouth at bedtime.       . ziprasidone (GEODON) 60 MG capsule Take 60 mg by mouth 2 (two) times daily with a meal.        . medroxyPROGESTERone (DEPO-PROVERA) 150 MG/ML injection Inject 1 mL (150 mg total) into the muscle every 3 (three) months.      . sulfamethoxazole-trimethoprim (BACTRIM DS) 800-160 MG per tablet Take 1 tablet by mouth daily.  9 tablet  0    OB/GYN Status:  No LMP recorded. Patient has had an injection.  General Assessment Data Assessment Number: 3  Living Arrangements: Group Home Can pt return to current living arrangement?: Yes Admission Status: Involuntary Is patient capable of signing voluntary admission?: No Transfer from: Acute Hospital Referral Source: MD  Education Status Is patient currently in school?: No Contact person: ARLENE PASQURA-OTHER-[phone]  Risk to self Suicidal Ideation: Yes-Currently Present Suicidal Intent: Yes-Currently Present Is patient at risk for suicide?: Yes Suicidal Plan?: Yes-Currently Present Specify Current Suicidal Plan: cut wrist Access to Means: Yes Specify Access to Suicidal Means: can obtain sharps What has been your use of drugs/alcohol within the last 12 months?: none Other Self Harm Risks: puts objects into bladder Triggers for Past Attempts: Other personal contacts Intentional Self Injurious Behavior: Cutting Comment - Self Injurious Behavior: cutting Factors that decrease suicide risk: Positive therapeutic relationships Family Suicide History: No Recent stressful life event(s): Conflict (Comment) Persecutory voices/beliefs?: No Depression: Yes Depression Symptoms: Tearfulness;Isolating;Loss of interest in usual pleasures;Feeling angry/irritable Substance abuse history and/or treatment for substance abuse?: No Suicide prevention information given to non-admitted patients: Not applicable  Risk to Others Homicidal Ideation: No Thoughts of Harm to Others:  No Current Homicidal Intent: No Current Homicidal Plan: No Access to Homicidal Means: No Identified Victim: none History of harm to others?: No Assessment of Violence: None Noted Violent Behavior Description: cooperative Does patient have access to weapons?: No Criminal Charges Pending?: No Does patient have a court date: No  Psychosis Hallucinations: None noted Delusions: Persecutory  Mental Status Report Appear/Hygiene: Improved Eye Contact: Good Motor Activity: Freedom of movement Speech: Logical/coherent Level of Consciousness: Alert Mood: Anxious;Preoccupied Affect: Appropriate to circumstance;Depressed;Sad Anxiety Level: Minimal Thought Processes: Coherent;Relevant Judgement: Unimpaired Orientation: Person;Place;Time;Situation Obsessive Compulsive Thoughts/Behaviors: Moderate  Cognitive Functioning Concentration: Normal Memory: Recent Intact;Remote Intact IQ: Average Insight: Poor Impulse Control: Poor Appetite: Good Weight Loss: 0  Weight Gain: 0  Sleep: No Change Total Hours of Sleep: 8  Vegetative Symptoms: None Vegetative Symptoms: None  Prior Inpatient Therapy Prior Inpatient Therapy: Yes Prior Therapy Dates: UNK Prior Therapy Facilty[facility]Provider([facility] Reason for Treatment: SELF-MULITATION  Prior Outpatient Therapy Prior Outpatient Therapy: Yes Prior Therapy Dates: 2012 Prior Therapy Facilty/Provider(s): [facility] Reason for Treatment: medication evaluation            Values / Beliefs Cultural Requests During Hospitalization: None Spiritual Requests During Hospitalization: None        Additional Information 1:1 In Past 12 Months?: No CIRT Risk: No Elopement Risk: No Does patient have medical clearance?: Yes     Disposition:  Disposition Disposition of Patient: Inpatient treatment program Type of inpatient treatment program: Adult (pending [facility]) Multiple contact with admission staff at [facility]. No  discharge has occurred  At this point in the day. The patient remains on the wait list. Dr Bebe Shaggy made aware of the situations. On Site Evaluation by:   Reviewed with Physician:     Jearld Pies 09/18/2011  5:20 PM

## 2011-09-18 NOTE — ED Notes (Signed)
Pt has been

## 2011-09-18 NOTE — ED Notes (Signed)
Pt sitting in the bed playing cards w/ the sitter. No acute distress noted. No needs voiced at this time.

## 2011-09-18 NOTE — ED Notes (Signed)
Pt given lunch. Pt asking when she can leave. Plan of care discussed with pt

## 2011-09-18 NOTE — ED Notes (Signed)
No issues overnight.   BP 120/58  Pulse 76  Temp(Src) 98.7 F (37.1 C) (Oral)  Resp 16  SpO2 98% [facility]  Glynn Octave, MD 09/18/11 [phone]

## 2011-09-18 NOTE — ED Notes (Signed)
Pt again asking when she is leaving. States "Tommy said maybe they'd have a discharge down there today and I can leave here. I've been here a week."  Pt given a soda and nabs.

## 2011-09-18 NOTE — ED Notes (Signed)
Care assumed, pt asleep in bed, sitter at bedside.  

## 2011-09-19 ENCOUNTER — Emergency Department (HOSPITAL_COMMUNITY): Payer: Medicaid Other

## 2011-09-19 MED ORDER — GUAIFENESIN 100 MG/5ML PO SOLN
5.0000 mL | ORAL | Status: DC | PRN
  Administered 2011-09-19 – 2011-09-20 (×5): 100 mg via ORAL

## 2011-09-19 MED ORDER — ALBUTEROL SULFATE (5 MG/ML) 0.5% IN NEBU
2.5000 mg | INHALATION_SOLUTION | Freq: Once | RESPIRATORY_TRACT | Status: AC
  Administered 2011-09-19: 2.5 mg via RESPIRATORY_TRACT

## 2011-09-19 NOTE — ED Notes (Signed)
Pt escorted to shower with sitter.  Calm, cooperative at this time.  Clean scrubs and clean bed linen provided.  Sitter at bedside.  nad noted.

## 2011-09-19 NOTE — ED Notes (Signed)
Pt requesting crayon for word puzzle.  Pt advised not to insert crayon into any bodily orifices.  Pt verbalized understanding.  Sitter at bedside and RN informed sitter to watch pt closely while crayon is being used.  nad noted at this time.

## 2011-09-19 NOTE — ED Notes (Signed)
Pt asleep in bed, resp even , sitter at bedside. 

## 2011-09-19 NOTE — ED Notes (Signed)
Pt awake up to bathroom, requested sprite and crackers, both given.  Sitter at bedside.

## 2011-09-19 NOTE — Progress Notes (Signed)
1725 Patient advised that she has had a cough for two days that is getting worse. Experiencing some chest discomfort when coughing. She has heard wheezing on occasion. Denies fever, chills. Cor: RRR, mo murmur, click or rub. Chest: Clear all fields, occasional end expiratory wheeze with forced expiration. WIll order chest xray and nebulizer treatment. 1900 Cough improved after nebulizer treatment. Xray negative for acute process. Dg Chest 2 View  09/19/2011  *RADIOLOGY REPORT*  Clinical Data: Cough.  Admission.  CHEST - 2 VIEW 09/19/2011:  Comparison: Two-view chest x-ray 08/22/2011 [facility].  Findings: Cardiomediastinal silhouette unremarkable and unchanged. Lungs clear.  Bronchovascular markings normal.  No pleural effusions.  Slight thoracic scoliosis convex right.  Visualized bony thorax otherwise intact.  IMPRESSION: No acute or significant abnormality.  Stable examination.  Original Report Authenticated By: Arnell Sieving, M.D.

## 2011-09-19 NOTE — ED Notes (Signed)
Pt awake, requested sprite and crackers--both given, requested tylenol and ativan.  Both given, also asked for cough meds--md notified.    Sitter at bedside

## 2011-09-19 NOTE — ED Notes (Signed)
Pt c/o generalized body aches and cough, requesting meds.  Meds given, refer to [facility].

## 2011-09-19 NOTE — ED Notes (Signed)
Bacitracin ointment and coverlet dressing applied to left hand over wound, edges well approximated, no s/s of infection noted.  Sitter at bedside.  nad noted.

## 2011-09-19 NOTE — ED Notes (Signed)
Provided pt with hospital bed for comfort.  nad noted.  Sitter at bedside.

## 2011-09-19 NOTE — BH Assessment (Signed)
Assessment Note   Cynthia Phelps is an 20 y.o. female.  The patient continues to wait for acceptance at [facility]. She was declined by area psych units with comments that she need long term treatment. Today she remains suicidal. She does not feel she can return  To the group home and be safe  She does not complain of hallucinations. She may   be delusional but is hard to discern.She  remains on petition.   Axis I: Mood Disorder NOS Axis II: Borderline Personality Dis. Axis III:  Past Medical History  Diagnosis Date  . Asthma   . Hyperlipidemia, acquired   . Borderline personality disorder   . Anxiety   . Mood disorder   . Depression    Axis IV: problems related to social environment and problems with primary support group Axis V: 21-30 behavior considerably influenced by delusions or hallucinations OR serious impairment in judgment, communication OR inability to function in almost all areas  Past Medical History:  Past Medical History  Diagnosis Date  . Asthma   . Hyperlipidemia, acquired   . Borderline personality disorder   . Anxiety   . Mood disorder   . Depression     Past Surgical History  Procedure Date  . Open cystotomy   . Foreign body removal 08/29/2011    Procedure: FOREIGN BODY REMOVAL ADULT;  Surgeon: Antony Haste, MD;  Location: [facility];  Service: Urology;  Laterality: N/A;  . Cystoscopy 08/29/2011    Procedure: CYSTOSCOPY;  Surgeon: Antony Haste, MD;  Location: [facility];  Service: Urology;  Laterality: N/A;  with right retrograde and cystogram  . Bladder surgery     Family History: History reviewed. No pertinent family history.  Social History:  reports that she has been smoking Cigarettes.  She has been smoking about 1 pack per day. She has never used smokeless tobacco. She reports that she does not drink alcohol or use illicit drugs.  Additional Social History:    Allergies:  Allergies  Allergen Reactions  . Haldol (Haloperidol Decanoate)  Shortness Of Breath and Other (See Comments)    Freezes up and tongue sticks out ,Unable to move arms and legs, muscle stiffness   . Latex     Sensitivity, not severe  . Doxycycline Itching, Rash and Other (See Comments)    Unable to move arms and legs, muscle stiffness  . Tetracyclines & Related Itching, Rash and Other (See Comments)    Unable to move arms and legs, muscle stiffness    Home Medications:  Medications Prior to Admission  Medication Dose Route Frequency Provider Last Rate Last Dose  . acetaminophen (TYLENOL) tablet 650 mg  650 mg Oral Q4H PRN Vida Roller, MD   650 mg at 09/19/11 1650  . albuterol (PROVENTIL) (5 MG/ML) 0.5% nebulizer solution 2.5 mg  2.5 mg Nebulization Once Nicoletta Dress. Colon Branch, MD   2.5 mg at 09/19/11 1757  . alum & mag hydroxide-simeth (MAALOX/MYLANTA) 200-200-20 MG/5ML suspension 30 mL  30 mL Oral PRN Vida Roller, MD      . bacitracin ointment 1 application  1 application Topical Daily Glynn Octave, MD   1 application at 09/19/11 0919  . citalopram (CELEXA) tablet 20 mg  20 mg Oral Daily Vida Roller, MD   20 mg at 09/19/11 0854  . guaiFENesin (ROBITUSSIN) 100 MG/5ML solution 100 mg  5 mL Oral Q4H PRN Sunnie Nielsen, MD   100 mg at 09/19/11 1651  . ibuprofen (ADVIL,MOTRIN) tablet  600 mg  600 mg Oral Q8H PRN Vida Roller, MD      . LORazepam (ATIVAN) tablet 1 mg  1 mg Oral Q8H PRN Vida Roller, MD   1 mg at 09/19/11 0519  . LORazepam (ATIVAN) tablet 1 mg  1 mg Oral Once Glynn Octave, MD   1 mg at 09/18/11 0431  . nicotine (NICODERM CQ - dosed in mg/24 hours) patch 21 mg  21 mg Transdermal Daily Vida Roller, MD   21 mg at 09/19/11 1610  . ondansetron (ZOFRAN) tablet 4 mg  4 mg Oral Q8H PRN Vida Roller, MD      . Lady Gary Leda Min) injection 0.5 mL  0.5 mL Intramuscular Once Joya Gaskins, MD   0.5 mL at 09/14/11 1748  . ziprasidone (GEODON) capsule 60 mg  60 mg Oral BID WC Vida Roller, MD   60 mg at 09/19/11 1651  . ziprasidone (GEODON)  injection 10 mg  10 mg Intramuscular Once Joya Gaskins, MD   10 mg at 09/14/11 1748  . zolpidem (AMBIEN) tablet 5 mg  5 mg Oral QHS PRN Vida Roller, MD   5 mg at 09/18/11 2110  . DISCONTD: acetaminophen (TYLENOL) tablet 650 mg  650 mg Oral Q4H PRN Nicoletta Dress. Colon Branch, MD      . DISCONTD: citalopram (CELEXA) tablet 20 mg  20 mg Oral Daily Nicoletta Dress. Colon Branch, MD   20 mg at 09/11/11 0902  . DISCONTD: docusate sodium (COLACE) capsule 100 mg  100 mg Oral BID Nicoletta Dress. Colon Branch, MD   100 mg at 09/11/11 0902  . DISCONTD: ibuprofen (ADVIL,MOTRIN) tablet 600 mg  600 mg Oral Q8H PRN Nicoletta Dress. Colon Branch, MD      . DISCONTD: nicotine (NICODERM CQ - dosed in mg/24 hours) patch 21 mg  21 mg Transdermal Daily Nicoletta Dress. Colon Branch, MD   21 mg at 09/11/11 0901  . DISCONTD: ondansetron (ZOFRAN) tablet 4 mg  4 mg Oral Q8H PRN Nicoletta Dress. Colon Branch, MD      . DISCONTD: polymixin-polysporin (POLYSPORIN) ointment   Topical Once Glynn Octave, MD      . DISCONTD: simvastatin (ZOCOR) tablet 20 mg  20 mg Oral Daily Nicoletta Dress. Colon Branch, MD   20 mg at 09/11/11 0901  . DISCONTD: tiotropium (SPIRIVA) inhalation capsule 18 mcg  18 mcg Inhalation Daily Nicoletta Dress. Colon Branch, MD   18 mcg at 09/11/11 0921  . DISCONTD: traZODone (DESYREL) tablet 100 mg  100 mg Oral BID Nicoletta Dress. Colon Branch, MD   100 mg at 09/11/11 0901  . DISCONTD: ziprasidone (GEODON) capsule 60 mg  60 mg Oral BID WC Nicoletta Dress. Colon Branch, MD   60 mg at 09/11/11 0902   Medications Prior to Admission  Medication Sig Dispense Refill  . albuterol (PROVENTIL HFA;VENTOLIN HFA) 108 (90 BASE) MCG/ACT inhaler Inhale 2 puffs into the lungs every 4 (four) hours as needed. For shortness of breath       . aspirin EC 81 MG tablet Take 81 mg by mouth daily.        . citalopram (CELEXA) 20 MG tablet Take 1 tablet (20 mg total) by mouth daily.  30 tablet  0  . cloZAPine (CLOZARIL) 100 MG tablet Take 200 mg by mouth daily.        Marland Kitchen omega-3 acid ethyl esters (LOVAZA) 1 G capsule Take 1 g by mouth 2 (two) times  daily.        . polyethylene glycol powder (  GLYCOLAX/MIRALAX) powder Take 17 g by mouth daily.        . simvastatin (ZOCOR) 20 MG tablet Take 20 mg by mouth daily.        Marland Kitchen tiotropium (SPIRIVA) 18 MCG inhalation capsule Place 18 mcg into inhaler and inhale daily.        . traZODone (DESYREL) 100 MG tablet Take 100-200 mg by mouth at bedtime.       . ziprasidone (GEODON) 60 MG capsule Take 60 mg by mouth 2 (two) times daily with a meal.        . medroxyPROGESTERone (DEPO-PROVERA) 150 MG/ML injection Inject 1 mL (150 mg total) into the muscle every 3 (three) months.      . sulfamethoxazole-trimethoprim (BACTRIM DS) 800-160 MG per tablet Take 1 tablet by mouth daily.  9 tablet  0    OB/GYN Status:  No LMP recorded. Patient has had an injection.  General Assessment Data Assessment Number: 3  Living Arrangements: Group Home Can pt return to current living arrangement?: Yes Admission Status: Involuntary Is patient capable of signing voluntary admission?: No Transfer from: Acute Hospital Referral Source: MD  Education Status Is patient currently in school?: No Contact person: ARLENE PASQURA-OTHER-[phone]  Risk to self Suicidal Ideation: Yes-Currently Present Suicidal Intent: Yes-Currently Present Is patient at risk for suicide?: Yes Suicidal Plan?: Yes-Currently Present Specify Current Suicidal Plan: cut wrist Access to Means: Yes Specify Access to Suicidal Means: can obtain sharps What has been your use of drugs/alcohol within the last 12 months?: none Other Self Harm Risks: puts objects into bladder Triggers for Past Attempts: Other personal contacts Intentional Self Injurious Behavior: Cutting Comment - Self Injurious Behavior: cutting Factors that decrease suicide risk: Positive therapeutic relationships Family Suicide History: No Recent stressful life event(s): Conflict (Comment) Persecutory voices/beliefs?: No Depression: Yes Depression Symptoms:  Tearfulness;Isolating;Loss of interest in usual pleasures;Feeling angry/irritable Substance abuse history and/or treatment for substance abuse?: No Suicide prevention information given to non-admitted patients: Not applicable  Risk to Others Homicidal Ideation: No Thoughts of Harm to Others: No Current Homicidal Intent: No Current Homicidal Plan: No Access to Homicidal Means: No Identified Victim: none History of harm to others?: No Assessment of Violence: None Noted Violent Behavior Description: cooperative Does patient have access to weapons?: No Criminal Charges Pending?: No Does patient have a court date: No  Psychosis Hallucinations: None noted Delusions: Persecutory  Mental Status Report Appear/Hygiene: Improved Eye Contact: Good Motor Activity: Freedom of movement Speech: Logical/coherent Level of Consciousness: Alert Mood: Anxious;Preoccupied Affect: Appropriate to circumstance;Depressed;Sad Anxiety Level: Minimal Thought Processes: Coherent;Relevant Judgement: Unimpaired Orientation: Person;Place;Time;Situation Obsessive Compulsive Thoughts/Behaviors: Moderate  Cognitive Functioning Concentration: Normal Memory: Recent Intact;Remote Intact IQ: Average Insight: Poor Impulse Control: Poor Appetite: Good Weight Loss: 0  Weight Gain: 0  Sleep: No Change Total Hours of Sleep: 8  Vegetative Symptoms: None Vegetative Symptoms: None  Prior Inpatient Therapy Prior Inpatient Therapy: Yes Prior Therapy Dates: UNK Prior Therapy Facilty/Provider([facility]): [facility] Reason for Treatment: SELF-MULITATION  Prior Outpatient Therapy Prior Outpatient Therapy: Yes Prior Therapy Dates: 2012 Prior [facility]ilty[facility]Provider([facility]): [facility] Reason for Treatment: medication evaluation            Values / Beliefs Cultural Requests During Hospitalization: None Spiritual Requests During Hospitalization: None        Additional Information 1:1 In  Past 12 Months?: No CIRT Risk: No Elopement Risk: No Does patient have medical clearance?: Yes     Disposition: PENDING [facility], [facility]ED [facility] WAITING LIST. Disposition  Disposition of Patient: Inpatient treatment program Type of inpatient treatment program: Adult (pending [facility]) Multiple contact with admission staff at [facility]. No discharge has occurred  At this point in the day. The patient remains on the wait list. Dr Bebe Shaggy made aware of the situations. On Site Evaluation by:   Reviewed with Physician:  COOK   CLINICIAN HAD TO REDO COMMITMENT PAPER FOR [facility] WAITING LIST.   Waldron Session 09/19/2011 11:00 PM

## 2011-09-19 NOTE — ED Notes (Signed)
Pt asleep in bed sitter at bedside. Lights dimmed in exam room

## 2011-09-20 MED ORDER — OXYMETAZOLINE HCL 0.05 % NA SOLN
1.0000 | Freq: Two times a day (BID) | NASAL | Status: DC
  Administered 2011-09-20 – 2011-09-22 (×4): 1 via NASAL
  Filled 2011-09-20: qty 15

## 2011-09-20 NOTE — BH Assessment (Signed)
Assessment Note   Cynthia Phelps is an 20 y.o. female.  The patient continues to wait for acceptance at [facility]. She was declined by area psych units with comments that she need long term treatment. Today she remains suicidal. She does not feel she can return  To the group home and be safe  She does not complain of hallucinations. She may   be delusional but is hard to discern.She  remains on petition.   Axis I: Mood Disorder NOS Axis II: Borderline Personality Dis. Axis III:  Past Medical History  Diagnosis Date  . Asthma   . Hyperlipidemia, acquired   . Borderline personality disorder   . Anxiety   . Mood disorder   . Depression    Axis IV: problems related to social environment and problems with primary support group Axis V: 21-30 behavior considerably influenced by delusions or hallucinations OR serious impairment in judgment, communication OR inability to function in almost all areas  Past Medical History:  Past Medical History  Diagnosis Date  . Asthma   . Hyperlipidemia, acquired   . Borderline personality disorder   . Anxiety   . Mood disorder   . Depression     Past Surgical History  Procedure Date  . Open cystotomy   . Foreign body removal 08/29/2011    Procedure: FOREIGN BODY REMOVAL ADULT;  Surgeon: Antony Haste, MD;  Location: [facility];  Service: Urology;  Laterality: N/A;  . Cystoscopy 08/29/2011    Procedure: CYSTOSCOPY;  Surgeon: Antony Haste, MD;  Location: [facility];  Service: Urology;  Laterality: N/A;  with right retrograde and cystogram  . Bladder surgery     Family History: History reviewed. No pertinent family history.  Social History:  reports that she has been smoking Cigarettes.  She has been smoking about 1 pack per day. She has never used smokeless tobacco. She reports that she does not drink alcohol or use illicit drugs.  Additional Social History:    Allergies:  Allergies  Allergen Reactions  . Haldol (Haloperidol Decanoate)  Shortness Of Breath and Other (See Comments)    Freezes up and tongue sticks out ,Unable to move arms and legs, muscle stiffness   . Latex     Sensitivity, not severe  . Doxycycline Itching, Rash and Other (See Comments)    Unable to move arms and legs, muscle stiffness  . Tetracyclines & Related Itching, Rash and Other (See Comments)    Unable to move arms and legs, muscle stiffness    Home Medications:  Medications Prior to Admission  Medication Dose Route Frequency Provider Last Rate Last Dose  . acetaminophen (TYLENOL) tablet 650 mg  650 mg Oral Q4H PRN Vida Roller, MD   650 mg at 09/19/11 1650  . albuterol (PROVENTIL) (5 MG/ML) 0.5% nebulizer solution 2.5 mg  2.5 mg Nebulization Once Nicoletta Dress. Colon Branch, MD   2.5 mg at 09/19/11 1757  . alum & mag hydroxide-simeth (MAALOX/MYLANTA) 200-200-20 MG/5ML suspension 30 mL  30 mL Oral PRN Vida Roller, MD      . bacitracin ointment 1 application  1 application Topical Daily Glynn Octave, MD   1 application at 09/20/11 1236  . citalopram (CELEXA) tablet 20 mg  20 mg Oral Daily Vida Roller, MD   20 mg at 09/20/11 1229  . guaiFENesin (ROBITUSSIN) 100 MG/5ML solution 100 mg  5 mL Oral Q4H PRN Sunnie Nielsen, MD   100 mg at 09/20/11 1235  . ibuprofen (ADVIL,MOTRIN) tablet  600 mg  600 mg Oral Q8H PRN Vida Roller, MD      . LORazepam (ATIVAN) tablet 1 mg  1 mg Oral Q8H PRN Vida Roller, MD   1 mg at 09/20/11 0044  . LORazepam (ATIVAN) tablet 1 mg  1 mg Oral Once Glynn Octave, MD   1 mg at 09/18/11 0431  . nicotine (NICODERM CQ - dosed in mg/24 hours) patch 21 mg  21 mg Transdermal Daily Vida Roller, MD   21 mg at 09/19/11 1610  . ondansetron (ZOFRAN) tablet 4 mg  4 mg Oral Q8H PRN Vida Roller, MD      . Lady Gary Leda Min) injection 0.5 mL  0.5 mL Intramuscular Once Joya Gaskins, MD   0.5 mL at 09/14/11 1748  . ziprasidone (GEODON) capsule 60 mg  60 mg Oral BID WC Vida Roller, MD   60 mg at 09/20/11 1230  . ziprasidone (GEODON)  injection 10 mg  10 mg Intramuscular Once Joya Gaskins, MD   10 mg at 09/14/11 1748  . zolpidem (AMBIEN) tablet 5 mg  5 mg Oral QHS PRN Vida Roller, MD   5 mg at 09/19/11 2336  . DISCONTD: acetaminophen (TYLENOL) tablet 650 mg  650 mg Oral Q4H PRN Nicoletta Dress. Colon Branch, MD      . DISCONTD: citalopram (CELEXA) tablet 20 mg  20 mg Oral Daily Nicoletta Dress. Colon Branch, MD   20 mg at 09/11/11 0902  . DISCONTD: docusate sodium (COLACE) capsule 100 mg  100 mg Oral BID Nicoletta Dress. Colon Branch, MD   100 mg at 09/11/11 0902  . DISCONTD: ibuprofen (ADVIL,MOTRIN) tablet 600 mg  600 mg Oral Q8H PRN Nicoletta Dress. Colon Branch, MD      . DISCONTD: nicotine (NICODERM CQ - dosed in mg/24 hours) patch 21 mg  21 mg Transdermal Daily Nicoletta Dress. Colon Branch, MD   21 mg at 09/11/11 0901  . DISCONTD: ondansetron (ZOFRAN) tablet 4 mg  4 mg Oral Q8H PRN Nicoletta Dress. Colon Branch, MD      . DISCONTD: polymixin-polysporin (POLYSPORIN) ointment   Topical Once Glynn Octave, MD      . DISCONTD: simvastatin (ZOCOR) tablet 20 mg  20 mg Oral Daily Nicoletta Dress. Colon Branch, MD   20 mg at 09/11/11 0901  . DISCONTD: tiotropium (SPIRIVA) inhalation capsule 18 mcg  18 mcg Inhalation Daily Nicoletta Dress. Colon Branch, MD   18 mcg at 09/11/11 0921  . DISCONTD: traZODone (DESYREL) tablet 100 mg  100 mg Oral BID Nicoletta Dress. Colon Branch, MD   100 mg at 09/11/11 0901  . DISCONTD: ziprasidone (GEODON) capsule 60 mg  60 mg Oral BID WC Nicoletta Dress. Colon Branch, MD   60 mg at 09/11/11 0902   Medications Prior to Admission  Medication Sig Dispense Refill  . albuterol (PROVENTIL HFA;VENTOLIN HFA) 108 (90 BASE) MCG/ACT inhaler Inhale 2 puffs into the lungs every 4 (four) hours as needed. For shortness of breath       . aspirin EC 81 MG tablet Take 81 mg by mouth daily.        . citalopram (CELEXA) 20 MG tablet Take 1 tablet (20 mg total) by mouth daily.  30 tablet  0  . cloZAPine (CLOZARIL) 100 MG tablet Take 200 mg by mouth daily.        Marland Kitchen omega-3 acid ethyl esters (LOVAZA) 1 G capsule Take 1 g by mouth 2 (two) times  daily.        . polyethylene glycol powder (  GLYCOLAX/MIRALAX) powder Take 17 g by mouth daily.        . simvastatin (ZOCOR) 20 MG tablet Take 20 mg by mouth daily.        Marland Kitchen tiotropium (SPIRIVA) 18 MCG inhalation capsule Place 18 mcg into inhaler and inhale daily.        . traZODone (DESYREL) 100 MG tablet Take 100-200 mg by mouth at bedtime.       . ziprasidone (GEODON) 60 MG capsule Take 60 mg by mouth 2 (two) times daily with a meal.        . medroxyPROGESTERone (DEPO-PROVERA) 150 MG/ML injection Inject 1 mL (150 mg total) into the muscle every 3 (three) months.      . sulfamethoxazole-trimethoprim (BACTRIM DS) 800-160 MG per tablet Take 1 tablet by mouth daily.  9 tablet  0    OB/GYN Status:  No LMP recorded. Patient has had an injection.  General Assessment Data Assessment Number: 3  Living Arrangements: Group Home Can pt return to current living arrangement?: Yes Admission Status: Involuntary Is patient capable of signing voluntary admission?: No Transfer from: Acute Hospital Referral Source: MD  Education Status Is patient currently in school?: No Contact person: ARLENE PASQURA-OTHER-[phone]  Risk to self Suicidal Ideation: Yes-Currently Present Suicidal Intent: Yes-Currently Present Is patient at risk for suicide?: Yes Suicidal Plan?: Yes-Currently Present Specify Current Suicidal Plan: cut wrist Access to Means: Yes Specify Access to Suicidal Means: can obtain sharps What has been your use of drugs/alcohol within the last 12 months?: none Other Self Harm Risks: puts objects into bladder Triggers for Past Attempts: Other personal contacts Intentional Self Injurious Behavior: Cutting Comment - Self Injurious Behavior: cutting Factors that decrease suicide risk: Positive therapeutic relationships Family Suicide History: No Recent stressful life event(s): Conflict (Comment) Persecutory voices/beliefs?: No Depression: Yes Depression Symptoms:  Tearfulness;Isolating;Loss of interest in usual pleasures;Feeling angry/irritable Substance abuse history and/or treatment for substance abuse?: No Suicide prevention information given to non-admitted patients: Not applicable  Risk to Others Homicidal Ideation: No Thoughts of Harm to Others: No Current Homicidal Intent: No Current Homicidal Plan: No Access to Homicidal Means: No Identified Victim: none History of harm to others?: No Assessment of Violence: None Noted Violent Behavior Description: cooperative Does patient have access to weapons?: No Criminal Charges Pending?: No Does patient have a court date: No  Psychosis Hallucinations: None noted Delusions: Persecutory  Mental Status Report Appear/Hygiene: Improved Eye Contact: Good Motor Activity: Freedom of movement Speech: Logical/coherent Level of Consciousness: Alert Mood: Anxious;Preoccupied Affect: Appropriate to circumstance;Depressed;Sad Anxiety Level: Minimal Thought Processes: Coherent;Relevant Judgement: Unimpaired Orientation: Person;Place;Time;Situation Obsessive Compulsive Thoughts/Behaviors: Moderate  Cognitive Functioning Concentration: Normal Memory: Recent Intact;Remote Intact IQ: Average Insight: Poor Impulse Control: Poor Appetite: Good Weight Loss: 0  Weight Gain: 0  Sleep: No Change Total Hours of Sleep: 8  Vegetative Symptoms: None Vegetative Symptoms: None  Prior Inpatient Therapy Prior Inpatient Therapy: Yes Prior Therapy Dates: UNK Prior [facility] Facilty[facility] Reason for Treatment: SELF-MULITATION  Prior Outpatient Therapy Prior Outpatient Therapy: Yes Prior Therapy Dates: 2012 Prior [facility]ilty[facility]Provider([facility]): [facility] Reason for Treatment: medication evaluation            Values / Beliefs Cultural Requests During Hospitalization: None Spiritual Requests During Hospitalization: None        Additional Information 1:1 In  Past 12 Months?: No CIRT Risk: No Elopement Risk: No Does patient have medical clearance?: Yes     Disposition: [facility]ENDING [facility], [facility] WA[facility]. Disposition  Disposition of Patient: Inpatient treatment program Type of inpatient treatment program: Adult (pending [facility]) Multiple contact with [facility] staff at [facility]. No discharge has occurred  At this point in the day. The patient remains on the wait list. Dr Bebe Shaggy made aware of the situations. On Site Evaluation by:   Reviewed with Physician: ZAMMIT   CLINICIAN REASSESSED PT & DISPOSITION STILL REMAIN THE SAME. CLINICIAN SPOKE WITH STEVE(RN) AT [facility] WHO STATED PT HAS NOT BEDS & WOULD NOT HAVE ANY BEDS THIS WEEKEND. COMMITMENT PAPER WERE UPDATED.   Waldron Session 09/20/2011 3:22 PM

## 2011-09-20 NOTE — ED Notes (Signed)
Pt amb in department. Sitter with pt. Pt cont to wait for bed assign at [facility]

## 2011-09-21 ENCOUNTER — Emergency Department (HOSPITAL_COMMUNITY): Payer: Medicaid Other

## 2011-09-21 ENCOUNTER — Encounter (HOSPITAL_COMMUNITY): Payer: Self-pay | Admitting: Emergency Medicine

## 2011-09-21 NOTE — BH Assessment (Signed)
Assessment Note   Cynthia Phelps is an 20 y.o. female. PT IS STILL PENDING ADMISSION TO [facility] EVENTHOUGHT SHE HAS BEEN ACCEPTED TO THE WAITING LIST, NO BEDS AVALIABLE THIS WEEKEND. PT DENIES IDEATION BUT STILL CONTINUE TO INSERT OBJECT INTO PRIVATE PART. PT IS AN ENDANGER TO SELF & NEEDS STABILIZATION. PT WILL REMAIN IN ED UNTIL TRANSFERRED TO [facility].  Axis I: Bipolar, mixed Axis II: Borderline Personality Dis. Axis III:  Past Medical History  Diagnosis Date  . Asthma   . Hyperlipidemia, acquired   . Borderline personality disorder   . Anxiety   . Mood disorder   . Depression    Axis IV: problems related to social environment Axis V: 31-40 impairment in reality testing  Past Medical History:  Past Medical History  Diagnosis Date  . Asthma   . Hyperlipidemia, acquired   . Borderline personality disorder   . Anxiety   . Mood disorder   . Depression     Past Surgical History  Procedure Date  . Open cystotomy   . Foreign body removal 08/29/2011    Procedure: FOREIGN BODY REMOVAL ADULT;  Surgeon: Antony Haste, MD;  Location: [facility];  Service: Urology;  Laterality: N/A;  . Cystoscopy 08/29/2011    Procedure: CYSTOSCOPY;  Surgeon: Antony Haste, MD;  Location: [facility];  Service: Urology;  Laterality: N/A;  with right retrograde and cystogram  . Bladder surgery     Family History: History reviewed. No pertinent family history.  Social History:  reports that she has been smoking Cigarettes.  She has been smoking about 1 pack per day. She has never used smokeless tobacco. She reports that she does not drink alcohol or use illicit drugs.  Additional Social History:    Allergies:  Allergies  Allergen Reactions  . Haldol (Haloperidol Decanoate) Shortness Of Breath and Other (See Comments)    Freezes up and tongue sticks out ,Unable to move arms and legs, muscle stiffness   . Latex     Sensitivity, not severe  . Doxycycline Itching, Rash and Other (See Comments)   Unable to move arms and legs, muscle stiffness  . Tetracyclines & Related Itching, Rash and Other (See Comments)    Unable to move arms and legs, muscle stiffness    Home Medications:  Medications Prior to Admission  Medication Dose Route Frequency Provider Last Rate Last Dose  . acetaminophen (TYLENOL) tablet 650 mg  650 mg Oral Q4H PRN Vida Roller, MD   650 mg at 09/21/11 1919  . albuterol (PROVENTIL) (5 MG/ML) 0.5% nebulizer solution 2.5 mg  2.5 mg Nebulization Once Nicoletta Dress. Colon Branch, MD   2.5 mg at 09/19/11 1757  . alum & mag hydroxide-simeth (MAALOX/MYLANTA) 200-200-20 MG/5ML suspension 30 mL  30 mL Oral PRN Vida Roller, MD      . bacitracin ointment 1 application  1 application Topical Daily Glynn Octave, MD   1 application at 09/21/11 1217  . citalopram (CELEXA) tablet 20 mg  20 mg Oral Daily Vida Roller, MD   20 mg at 09/21/11 1216  . guaiFENesin (ROBITUSSIN) 100 MG/5ML solution 100 mg  5 mL Oral Q4H PRN Sunnie Nielsen, MD   100 mg at 09/20/11 1839  . ibuprofen (ADVIL,MOTRIN) tablet 600 mg  600 mg Oral Q8H PRN Vida Roller, MD      . LORazepam (ATIVAN) tablet 1 mg  1 mg Oral Q8H PRN Vida Roller, MD   1 mg at 09/20/11 0044  .  LORazepam (ATIVAN) tablet 1 mg  1 mg Oral Once Glynn Octave, MD   1 mg at 09/18/11 0431  . nicotine (NICODERM CQ - dosed in mg/24 hours) patch 21 mg  21 mg Transdermal Daily Vida Roller, MD   21 mg at 09/21/11 1137  . ondansetron (ZOFRAN) tablet 4 mg  4 mg Oral Q8H PRN Vida Roller, MD      . oxymetazoline (AFRIN) 0.05 % nasal spray 1 spray  1 spray Each Nare BID Nicoletta Dress. Colon Branch, MD   1 spray at 09/21/11 1903  . TDaP (BOOSTRIX) injection 0.5 mL  0.5 mL Intramuscular Once Joya Gaskins, MD   0.5 mL at 09/14/11 1748  . ziprasidone (GEODON) capsule 60 mg  60 mg Oral BID WC Vida Roller, MD   60 mg at 09/21/11 1904  . ziprasidone (GEODON) injection 10 mg  10 mg Intramuscular Once Joya Gaskins, MD   10 mg at 09/14/11 1748  . zolpidem  (AMBIEN) tablet 5 mg  5 mg Oral QHS PRN Vida Roller, MD   5 mg at 09/19/11 2336  . DISCONTD: acetaminophen (TYLENOL) tablet 650 mg  650 mg Oral Q4H PRN Nicoletta Dress. Colon Branch, MD      . DISCONTD: citalopram (CELEXA) tablet 20 mg  20 mg Oral Daily Nicoletta Dress. Colon Branch, MD   20 mg at 09/11/11 0902  . DISCONTD: docusate sodium (COLACE) capsule 100 mg  100 mg Oral BID Nicoletta Dress. Colon Branch, MD   100 mg at 09/11/11 0902  . DISCONTD: ibuprofen (ADVIL,MOTRIN) tablet 600 mg  600 mg Oral Q8H PRN Nicoletta Dress. Colon Branch, MD      . DISCONTD: nicotine (NICODERM CQ - dosed in mg/24 hours) patch 21 mg  21 mg Transdermal Daily Nicoletta Dress. Colon Branch, MD   21 mg at 09/11/11 0901  . DISCONTD: ondansetron (ZOFRAN) tablet 4 mg  4 mg Oral Q8H PRN Nicoletta Dress. Colon Branch, MD      . DISCONTD: polymixin-polysporin (POLYSPORIN) ointment   Topical Once Glynn Octave, MD      . DISCONTD: simvastatin (ZOCOR) tablet 20 mg  20 mg Oral Daily Nicoletta Dress. Colon Branch, MD   20 mg at 09/11/11 0901  . DISCONTD: tiotropium (SPIRIVA) inhalation capsule 18 mcg  18 mcg Inhalation Daily Nicoletta Dress. Colon Branch, MD   18 mcg at 09/11/11 0921  . DISCONTD: traZODone (DESYREL) tablet 100 mg  100 mg Oral BID Nicoletta Dress. Colon Branch, MD   100 mg at 09/11/11 0901  . DISCONTD: ziprasidone (GEODON) capsule 60 mg  60 mg Oral BID WC Nicoletta Dress. Colon Branch, MD   60 mg at 09/11/11 0902   Medications Prior to Admission  Medication Sig Dispense Refill  . albuterol (PROVENTIL HFA;VENTOLIN HFA) 108 (90 BASE) MCG/ACT inhaler Inhale 2 puffs into the lungs every 4 (four) hours as needed. For shortness of breath       . aspirin EC 81 MG tablet Take 81 mg by mouth daily.        . citalopram (CELEXA) 20 MG tablet Take 1 tablet (20 mg total) by mouth daily.  30 tablet  0  . cloZAPine (CLOZARIL) 100 MG tablet Take 200 mg by mouth daily.        Marland Kitchen omega-3 acid ethyl esters (LOVAZA) 1 G capsule Take 1 g by mouth 2 (two) times daily.        . polyethylene glycol powder (GLYCOLAX/MIRALAX) powder Take 17 g by mouth daily.        Marland Kitchen  simvastatin (ZOCOR) 20 MG tablet Take 20 mg by mouth daily.        Marland Kitchen tiotropium (SPIRIVA) 18 MCG inhalation capsule Place 18 mcg into inhaler and inhale daily.        . traZODone (DESYREL) 100 MG tablet Take 100-200 mg by mouth at bedtime.       . ziprasidone (GEODON) 60 MG capsule Take 60 mg by mouth 2 (two) times daily with a meal.        . medroxyPROGESTERone (DEPO-PROVERA) 150 MG/ML injection Inject 1 mL (150 mg total) into the muscle every 3 (three) months.      . sulfamethoxazole-trimethoprim (BACTRIM DS) 800-160 MG per tablet Take 1 tablet by mouth daily.  9 tablet  0    OB/GYN Status:  No LMP recorded. Patient has had an injection.  General Assessment Data Assessment Number: 3  Living Arrangements: Group Home Can pt return to current living arrangement?: Yes Admission Status: Involuntary Is patient capable of signing voluntary admission?: No Transfer from: Acute Hospital Referral Source: MD  Education Status Is patient currently in school?: No Contact person: ARLENE PASQURA-[phone]  Risk to self Suicidal Ideation: Yes-Currently Present Suicidal Intent: Yes-Currently Present Is patient at risk for suicide?: Yes Suicidal Plan?: Yes-Currently Present Specify Current Suicidal Plan: cut wrist Access to Means: Yes Specify Access to Suicidal Means: can obtain sharps What has been your use of drugs/alcohol within the last 12 months?: none Other Self Harm Risks: puts objects into bladder Triggers for Past Attempts: Other personal contacts Intentional Self Injurious Behavior: Cutting Comment - Self Injurious Behavior: cutting Factors that decrease suicide risk: Positive therapeutic relationships Family Suicide History: No Recent stressful life event(s): Conflict (Comment) Persecutory voices/beliefs?: No Depression: Yes Depression Symptoms: Tearfulness;Isolating;Loss of interest in usual pleasures;Feeling angry/irritable Substance abuse history and/or treatment for  substance abuse?: Yes Suicide prevention information given to non-admitted patients: Not applicable  Risk to Others Homicidal Ideation: No Thoughts of Harm to Others: No Current Homicidal Intent: No Current Homicidal Plan: No Access to Homicidal Means: No Identified Victim: none History of harm to others?: No Assessment of Violence: None Noted Violent Behavior Description: cooperative Does patient have access to weapons?: No Criminal Charges Pending?: No Does patient have a court date: No  Psychosis Hallucinations: None noted Delusions: Persecutory  Mental Status Report Appear/Hygiene: Improved Eye Contact: Good Motor Activity: Freedom of movement Speech: Logical/coherent Level of Consciousness: Alert Mood: Anxious;Preoccupied Affect: Appropriate to circumstance;Depressed;Sad Anxiety Level: Minimal Thought Processes: Coherent;Relevant Judgement: Unimpaired Orientation: Person;Place;Time;Situation Obsessive Compulsive Thoughts/Behaviors: Moderate  Cognitive Functioning Concentration: Normal Memory: Recent Intact;Remote Intact IQ: Average Insight: Poor Impulse Control: Poor Appetite: Good Weight Loss: 0  Weight Gain: 0  Sleep: No Change Total Hours of Sleep: 8  Vegetative Symptoms: None Vegetative Symptoms: None  Prior Inpatient Therapy Prior Inpatient Therapy: Yes Prior Therapy Dates: UNK Prior Therapy Facilty[facility]Provider([facility]): [facility] Reason for Treatment: SELF-MULITATION  Prior Outpatient Therapy Prior Outpatient Therapy: Yes Prior Therapy Dates: 2012 Prior Therapy [facility]ilty/Provider([facility]): [facility] Reason for Treatment: medication evaluation            Values / Beliefs Cultural Requests During Hospitalization: None Spiritual Requests During Hospitalization: None        Additional Information 1:1 In Past 12 Months?: No CIRT Risk: No Elopement Risk: No Does patient have medical clearance?: Yes     Disposition:    Disposition Disposition of Patient: Inpatient treatment program Type of inpatient treatment program: Adult (pending [facility])  On Site Evaluation by:  Reviewed with Physician:     Waldron Session 09/21/2011 10:38 PM

## 2011-09-21 NOTE — ED Notes (Signed)
Pt ambulated around nurses' station with sitter.  NAD at this time.

## 2011-09-21 NOTE — ED Notes (Signed)
Called AC for 1700 dose of Geodon. AC to bring med as soon as she can. This information relayed to Loraine Grip, RN, Cabin crew.

## 2011-09-21 NOTE — ED Notes (Signed)
Patient went to bathroom. Upon returning to room she asked and was given warm blanket.

## 2011-09-21 NOTE — ED Notes (Signed)
Pt took a shower Psychiatrist remained with pt) and bed linens were changed.  NAD at this time.  Pt currently sitting in chair reading.  Sitter at bedside.

## 2011-09-21 NOTE — ED Notes (Signed)
Note cont'd : Vaginal inspection. Adapter fell when pt got up to go to xray. NAD.

## 2011-09-21 NOTE — ED Notes (Signed)
Pt went to restroom. I switched off with sitter when pt returned so sitter could talk with take another pt to restroom. Pt got into bed and remained there while sitter was with other pt. Upon sitters return, she noticed O2 adapter missing from bedside. Pt continued to deny getting adapter. Pt finially admitted that she did get adapter as she was getting back into the bed and inserted it into the urthrea. Did visual check to urethra and did not see anything. Also performed vaginal

## 2011-09-21 NOTE — ED Notes (Signed)
Pharmacy called for patient's Celexa.

## 2011-09-22 NOTE — ED Notes (Signed)
Meal tray was given to pt.  

## 2011-09-22 NOTE — ED Notes (Signed)
Patient given personal belongings given back to patient to get dressed. Patient provided phone to call for ride home.

## 2011-09-22 NOTE — ED Notes (Signed)
Patient with no complaints at this time. Respirations even and unlabored. Skin warm/dry. Discharge instructions reviewed with patient at this time. Patient given opportunity to voice concerns/ask questions. Patient discharged at this time and left Emergency Department with steady gait.   

## 2011-09-22 NOTE — ED Notes (Signed)
Patient provided hot lunch tray.

## 2011-09-22 NOTE — ED Notes (Signed)
Patient took shower under supervision of Lawayne, ED tech. Patient given new scrubs, socks. Oral care products provided. Patient returned to room.

## 2011-09-22 NOTE — ED Notes (Signed)
Pt escorted to shower and brush teeth by Cathlyn Parsons.

## 2011-09-22 NOTE — ED Notes (Signed)
RN spoke to [place] at home away from home. They will come pick patient up shortly. Patient to wait in waiting room for ride home.  Patient agreed to sign no harm contract and stated she will call for help before harming self. No harm contract placed with paper chart.

## 2011-09-22 NOTE — BH Assessment (Signed)
Assessment Note   Cynthia Phelps is an 20 y.o. female.   [name] remains in the [facility] ED. She came to the ED on 09/13/11 with complaints of suicidal ideation, she had made superficial cuts to her wrists a few days before  And contracted for safety. On the 1st she was again suicidal  and could not contract for safety. She continues to talk about putting items into her bladder, she told staff she had done this with a crayon in the ed but nothing was found. It is reported that she tried to put  A piece of oxygen equipment in herself yesterday .  She also continues to talk about cutting her wrist if she returns to the group home.  Axis I: Major Depression, Recurrent severe Axis II: Borderline Personality Dis. Axis III:  Past Medical History  Diagnosis Date  . Asthma   . Hyperlipidemia, acquired   . Borderline personality disorder   . Anxiety   . Mood disorder   . Depression    Axis IV: problems related to social environment and problems with primary support group Axis V: 11-20 some danger of hurting self or others possible OR occasionally fails to maintain minimal personal hygiene OR gross impairment in communication  Past Medical History:  Past Medical History  Diagnosis Date  . Asthma   . Hyperlipidemia, acquired   . Borderline personality disorder   . Anxiety   . Mood disorder   . Depression     Past Surgical History  Procedure Date  . Open cystotomy   . Foreign body removal 08/29/2011    Procedure: FOREIGN BODY REMOVAL ADULT;  Surgeon: Antony Haste, MD;  Location: [facility];  Service: Urology;  Laterality: N/A;  . Cystoscopy 08/29/2011    Procedure: CYSTOSCOPY;  Surgeon: Antony Haste, MD;  Location: [facility];  Service: Urology;  Laterality: N/A;  with right retrograde and cystogram  . Bladder surgery     Family History: History reviewed. No pertinent family history.  Social History:  reports that she has been smoking Cigarettes.  She has been smoking  about 1 pack per day. She has never used smokeless tobacco. She reports that she does not drink alcohol or use illicit drugs.  Additional Social History:    Allergies:  Allergies  Allergen Reactions  . Haldol (Haloperidol Decanoate) Shortness Of Breath and Other (See Comments)    Freezes up and tongue sticks out ,Unable to move arms and legs, muscle stiffness   . Latex     Sensitivity, not severe  . Doxycycline Itching, Rash and Other (See Comments)    Unable to move arms and legs, muscle stiffness  . Tetracyclines & Related Itching, Rash and Other (See Comments)    Unable to move arms and legs, muscle stiffness    Home Medications:  Medications Prior to Admission  Medication Dose Route Frequency Provider Last Rate Last Dose  . acetaminophen (TYLENOL) tablet 650 mg  650 mg Oral Q4H PRN Vida Roller, MD   650 mg at 09/21/11 1919  . albuterol (PROVENTIL) (5 MG/ML) 0.5% nebulizer solution 2.5 mg  2.5 mg Nebulization Once Nicoletta Dress. Colon Branch, MD   2.5 mg at 09/19/11 1757  . alum & mag hydroxide-simeth (MAALOX/MYLANTA) 200-200-20 MG/5ML suspension 30 mL  30 mL Oral PRN Vida Roller, MD      . bacitracin ointment 1 application  1 application Topical Daily Glynn Octave, MD   1 application at 09/21/11 1217  . citalopram (CELEXA)  tablet 20 mg  20 mg Oral Daily Vida Roller, MD   20 mg at 09/22/11 1610  . guaiFENesin (ROBITUSSIN) 100 MG/5ML solution 100 mg  5 mL Oral Q4H PRN Sunnie Nielsen, MD   100 mg at 09/20/11 1839  . ibuprofen (ADVIL,MOTRIN) tablet 600 mg  600 mg Oral Q8H PRN Vida Roller, MD      . LORazepam (ATIVAN) tablet 1 mg  1 mg Oral Q8H PRN Vida Roller, MD   1 mg at 09/20/11 0044  . LORazepam (ATIVAN) tablet 1 mg  1 mg Oral Once Glynn Octave, MD   1 mg at 09/18/11 0431  . nicotine (NICODERM CQ - dosed in mg/24 hours) patch 21 mg  21 mg Transdermal Daily Vida Roller, MD   21 mg at 09/22/11 0907  . ondansetron (ZOFRAN) tablet 4 mg  4 mg Oral Q8H PRN Vida Roller, MD        . oxymetazoline (AFRIN) 0.05 % nasal spray 1 spray  1 spray Each Nare BID Nicoletta Dress. Colon Branch, MD   1 spray at 09/22/11 [phone]  . TDaP (BOOSTRIX) injection 0.5 mL  0.5 mL Intramuscular Once Joya Gaskins, MD   0.5 mL at 09/14/11 1748  . ziprasidone (GEODON) capsule 60 mg  60 mg Oral BID WC Vida Roller, MD   60 mg at 09/22/11 5409  . ziprasidone (GEODON) injection 10 mg  10 mg Intramuscular Once Joya Gaskins, MD   10 mg at 09/14/11 1748  . zolpidem (AMBIEN) tablet 5 mg  5 mg Oral QHS PRN Vida Roller, MD   5 mg at 09/19/11 2336  . DISCONTD: acetaminophen (TYLENOL) tablet 650 mg  650 mg Oral Q4H PRN Nicoletta Dress. Colon Branch, MD      . DISCONTD: citalopram (CELEXA) tablet 20 mg  20 mg Oral Daily Nicoletta Dress. Colon Branch, MD   20 mg at 09/11/11 0902  . DISCONTD: docusate sodium (COLACE) capsule 100 mg  100 mg Oral BID Nicoletta Dress. Colon Branch, MD   100 mg at 09/11/11 0902  . DISCONTD: ibuprofen (ADVIL,MOTRIN) tablet 600 mg  600 mg Oral Q8H PRN Nicoletta Dress. Colon Branch, MD      . DISCONTD: nicotine (NICODERM CQ - dosed in mg/24 hours) patch 21 mg  21 mg Transdermal Daily Nicoletta Dress. Colon Branch, MD   21 mg at 09/11/11 0901  . DISCONTD: ondansetron (ZOFRAN) tablet 4 mg  4 mg Oral Q8H PRN Nicoletta Dress. Colon Branch, MD      . DISCONTD: polymixin-polysporin (POLYSPORIN) ointment   Topical Once Glynn Octave, MD      . DISCONTD: simvastatin (ZOCOR) tablet 20 mg  20 mg Oral Daily Nicoletta Dress. Colon Branch, MD   20 mg at 09/11/11 0901  . DISCONTD: tiotropium (SPIRIVA) inhalation capsule 18 mcg  18 mcg Inhalation Daily Nicoletta Dress. Colon Branch, MD   18 mcg at 09/11/11 0921  . DISCONTD: traZODone (DESYREL) tablet 100 mg  100 mg Oral BID Nicoletta Dress. Colon Branch, MD   100 mg at 09/11/11 0901  . DISCONTD: ziprasidone (GEODON) capsule 60 mg  60 mg Oral BID WC Nicoletta Dress. Colon Branch, MD   60 mg at 09/11/11 0902   Medications Prior to Admission  Medication Sig Dispense Refill  . albuterol (PROVENTIL HFA;VENTOLIN HFA) 108 (90 BASE) MCG/ACT inhaler Inhale 2 puffs into the lungs every 4 (four)  hours as needed. For shortness of breath       . aspirin EC 81 MG tablet Take 81 mg by mouth  daily.        . citalopram (CELEXA) 20 MG tablet Take 1 tablet (20 mg total) by mouth daily.  30 tablet  0  . cloZAPine (CLOZARIL) 100 MG tablet Take 200 mg by mouth daily.        Marland Kitchen omega-3 acid ethyl esters (LOVAZA) 1 G capsule Take 1 g by mouth 2 (two) times daily.        . polyethylene glycol powder (GLYCOLAX/MIRALAX) powder Take 17 g by mouth daily.        . simvastatin (ZOCOR) 20 MG tablet Take 20 mg by mouth daily.        Marland Kitchen tiotropium (SPIRIVA) 18 MCG inhalation capsule Place 18 mcg into inhaler and inhale daily.        . traZODone (DESYREL) 100 MG tablet Take 100-200 mg by mouth at bedtime.       . ziprasidone (GEODON) 60 MG capsule Take 60 mg by mouth 2 (two) times daily with a meal.        . medroxyPROGESTERone (DEPO-PROVERA) 150 MG/ML injection Inject 1 mL (150 mg total) into the muscle every 3 (three) months.      . sulfamethoxazole-trimethoprim (BACTRIM DS) 800-160 MG per tablet Take 1 tablet by mouth daily.  9 tablet  0    OB/GYN Status:  No LMP recorded. Patient has had an injection.  General Assessment Data Assessment Number: 1  Living Arrangements: Group Home Can pt return to current living arrangement?: Yes Admission Status: Involuntary Is patient capable of signing voluntary admission?: No Transfer from: Acute Hospital Referral Source: MD  Education Status Is patient currently in school?: No Contact person: ARLENE PASQURA-OTHER-[phone]  Risk to self Suicidal Ideation: Yes-Currently Present Suicidal Intent: Yes-Currently Present Is patient at risk for suicide?: Yes Suicidal Plan?: Yes-Currently Present Specify Current Suicidal Plan: cut wrists Access to Means: Yes Specify Access to Suicidal Means: sharpes avaible at home What has been your use of drugs/alcohol within the last 12 months?: none Previous Attempts/Gestures: Yes Other Self Harm Risks: yes, puts items  into bladder Triggers for Past Attempts: Other personal contacts Intentional Self Injurious Behavior: Damaging Comment - Self Injurious Behavior:  pushing items into bladder through ureathia Factors that decrease suicide risk: Positive therapeutic relationships Family Suicide History: No Recent stressful life event(s): Conflict (Comment) Persecutory voices/beliefs?: No Depression: Yes Depression Symptoms: Tearfulness;Isolating;Feeling angry/irritable Substance abuse history and/or treatment for substance abuse?: No Suicide prevention information given to non-admitted patients: Not applicable  Risk to Others Homicidal Ideation: No Thoughts of Harm to Others: No Current Homicidal Intent: No Current Homicidal Plan: No Access to Homicidal Means: No Identified Victim: na History of harm to others?: No Assessment of Violence: None Noted Violent Behavior Description: na Does patient have access to weapons?: No Criminal Charges Pending?: No Does patient have a court date: No  Psychosis Hallucinations: None noted Delusions: Persecutory  Mental Status Report Appear/Hygiene: Improved Eye Contact: Good Motor Activity: Freedom of movement Speech: Logical/coherent Level of Consciousness: Alert Mood: Anxious;Preoccupied Affect: Appropriate to circumstance;Depressed;Sad Anxiety Level: Minimal Thought Processes: Coherent;Relevant Judgement: Unimpaired Orientation: Person;Place;Time;Situation Obsessive Compulsive Thoughts/Behaviors: Minimal  Cognitive Functioning Concentration: Normal Memory: Recent Intact;Remote Intact IQ: Average Insight: Poor Impulse Control: Poor Appetite: Good Weight Loss: 0  Weight Gain: 0  Sleep: No Change Total Hours of Sleep: 8  Vegetative Symptoms: None Vegetative Symptoms: None  Prior Inpatient Therapy Prior Inpatient Therapy: Yes Prior Therapy Dates: UNK Prior Therapy Facilty[facility]Provider([facility] Reason for Treatment:  SELF-MULITATION  Prior Outpatient  Therapy Prior Outpatient Therapy: Yes Prior Therapy Dates: 2012 Prior Therapy [facility]ilty[facility]Provider([facility] Reason for Treatment: medication evaluation            Values / Beliefs Cultural Requests During Hospitalization: None Spiritual Requests During Hospitalization: None        Additional Information 1:1 In Past 12 Months?: No CIRT Risk: No Elopement Risk: No Does patient have medical clearance?: Yes     Disposition:  Disposition Disposition of Patient: Inpatient treatment program;Referred to Type of inpatient treatment program: Adult Patient referred to: [facility] Patient remains on the wait list at [facility]. Staff in admitting can give no date of possible admission. Current IVC forms expire 09/23/11 and will be redone if needed. Valeria Batman MD, agrees with this plan.  On Site Evaluation by:   Reviewed with Physician:     Jearld Pies 09/22/2011 9:53 AM

## 2011-09-22 NOTE — ED Notes (Signed)
Pt requesting medications,  Called pharmacy and they will bring.

## 2011-09-22 NOTE — ED Provider Notes (Signed)
Pt had a tele pysc evaluation today and she is no longer suicidal  Benny Lennert, MD 09/22/11 1427

## 2011-09-22 NOTE — ED Notes (Signed)
Watching TV and up at intervals to pace to doorway then back to stretcher.

## 2011-09-22 NOTE — Consult Note (Signed)
Spoke with Mr Cynthia Phelps, father of the patient. He was interested in the progress we were making with his daughters placement in a psychiatric hospital. The current situation was discussed with him and he is in agreement with the plan to place the patient in the [facility].  He did request that we contact him when plans were completed.

## 2011-09-22 NOTE — ED Notes (Signed)
Morning meds received from pharmacy

## 2011-09-22 NOTE — ED Notes (Signed)
Tommy with ACT team requesting Tele-psych consult since patient requesting to go home. Dr Estell Harpin aware and agrees. Paper work gathered and waiting to hear back from tele-psych group.

## 2011-09-22 NOTE — ED Notes (Signed)
Sitter with pt.  Pt alert, NAD.  Secured pt's room.

## 2012-02-12 IMAGING — CR DG ABDOMEN 1V
1 series · 2 of 2 positions shown · non-contrast
Comparison: none

REASON FOR EXAM: foreign body in bladder pt need films
COMMENTS:

[Series 1: view not recorded · 0.17mm/px · 2 of 2 slices shown]
[im 1/2]
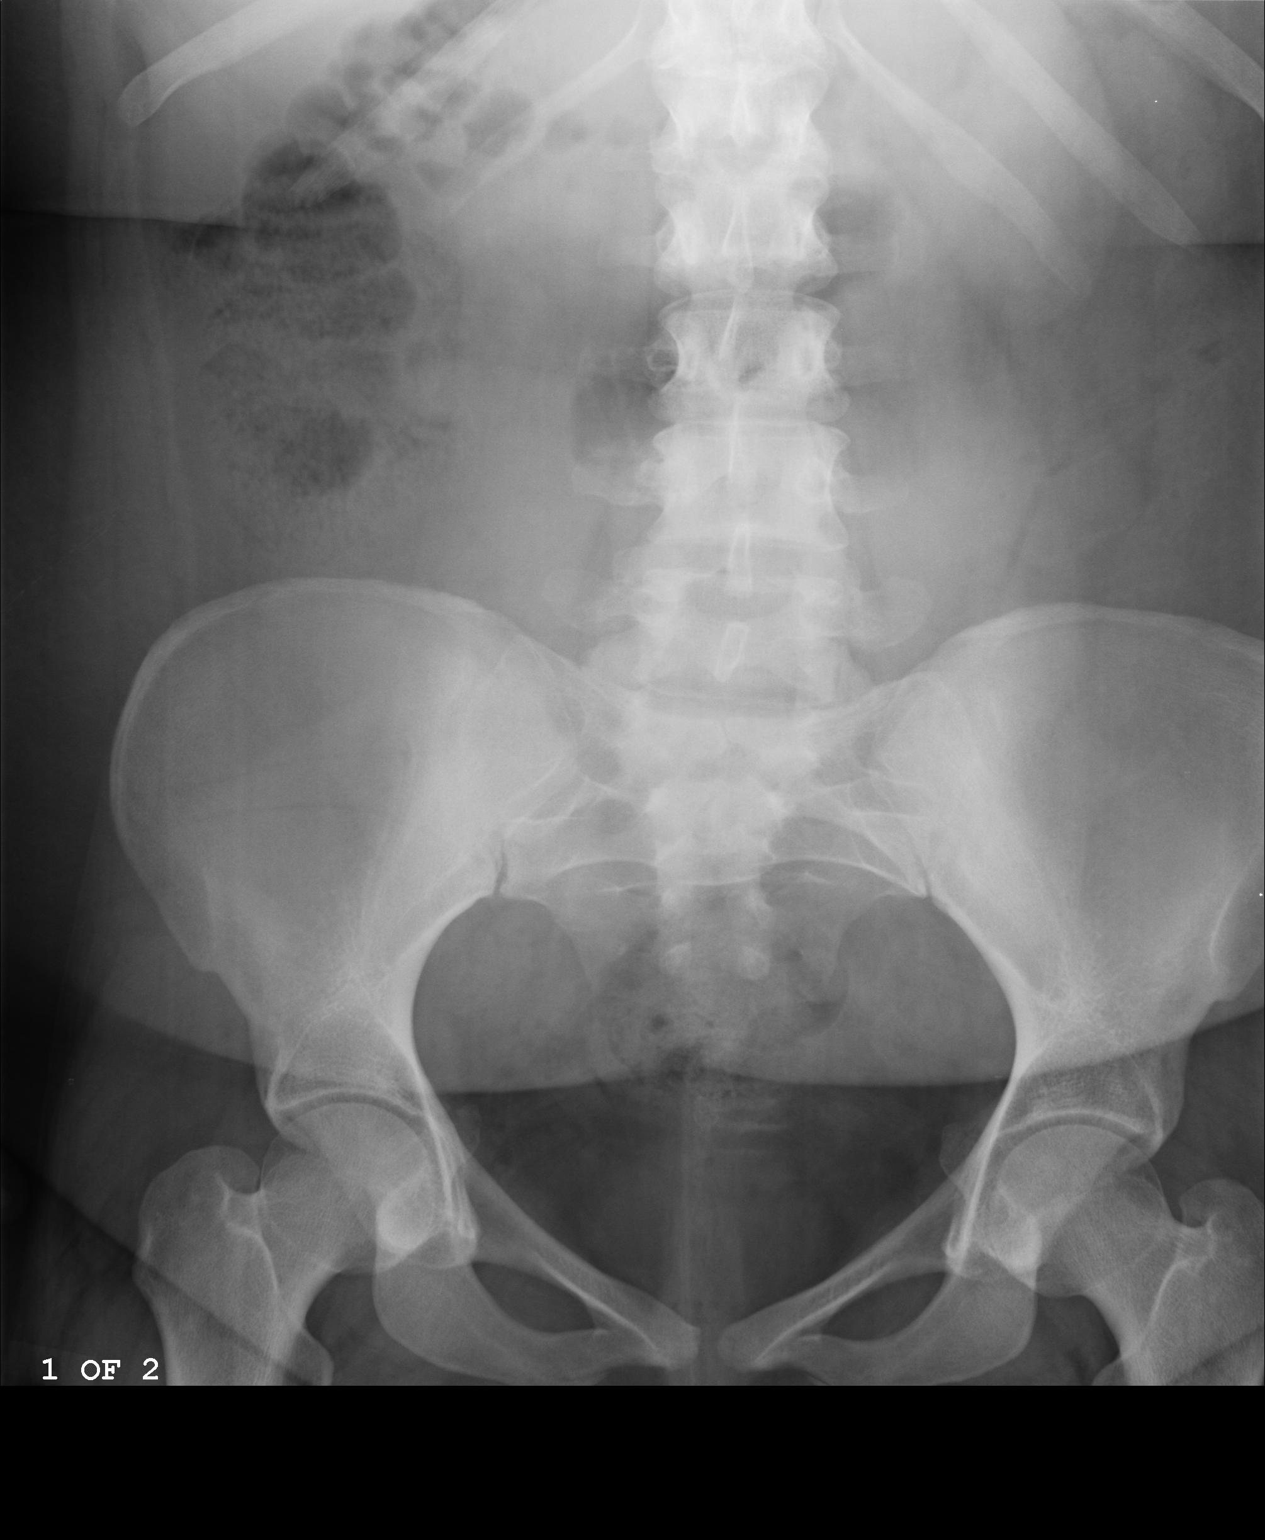
[im 2/2]
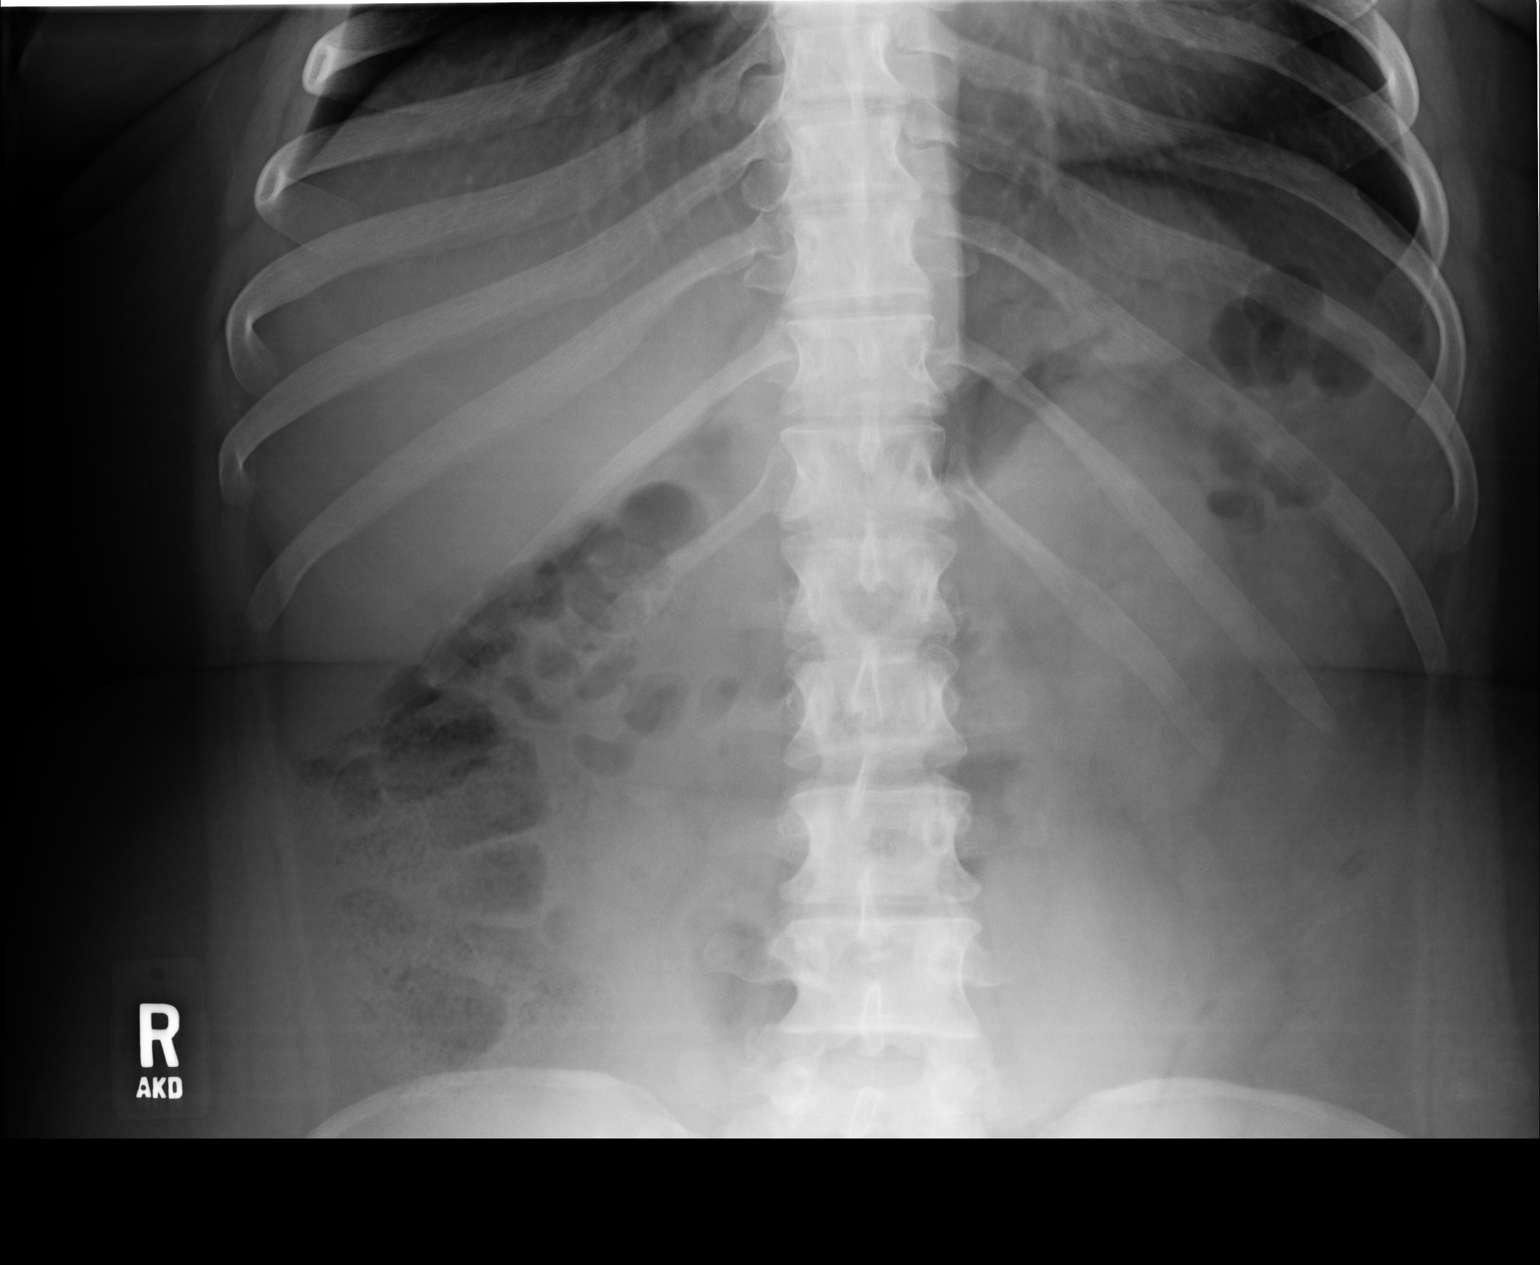

[2 of 2 positions shown; findings below may reference images not displayed]

PROCEDURE:     DXR - DXR KIDNEY URETER BLADDER  - January 17, 2010 [DATE]

RESULT:     An AP view of the abdomen shows a normal bowel gas pattern. No
definite renal or ureteral calcifications are seen. No foreign body is
identified in the pelvis. A non-opaque foreign body could be present and not
detected. The osseous structures of the bony pelvis are normal in
appearance. There is a moderate amount of fecal material in the ascending
colon. No acute bony abnormalities are seen.
IMPRESSION: 1. No acute changes are identified.
2. No foreign body is seen in the region of the urinary bladder.

## 2013-09-19 IMAGING — CR DG ABDOMEN 1V
2 series · 2 of 2 positions shown · non-contrast
Comparison: None.

CLINICAL DATA: The patient reports placing an ink pen into her
urinary tract.

ABDOMEN - 1 VIEW

[view not recorded (1 of 2)]
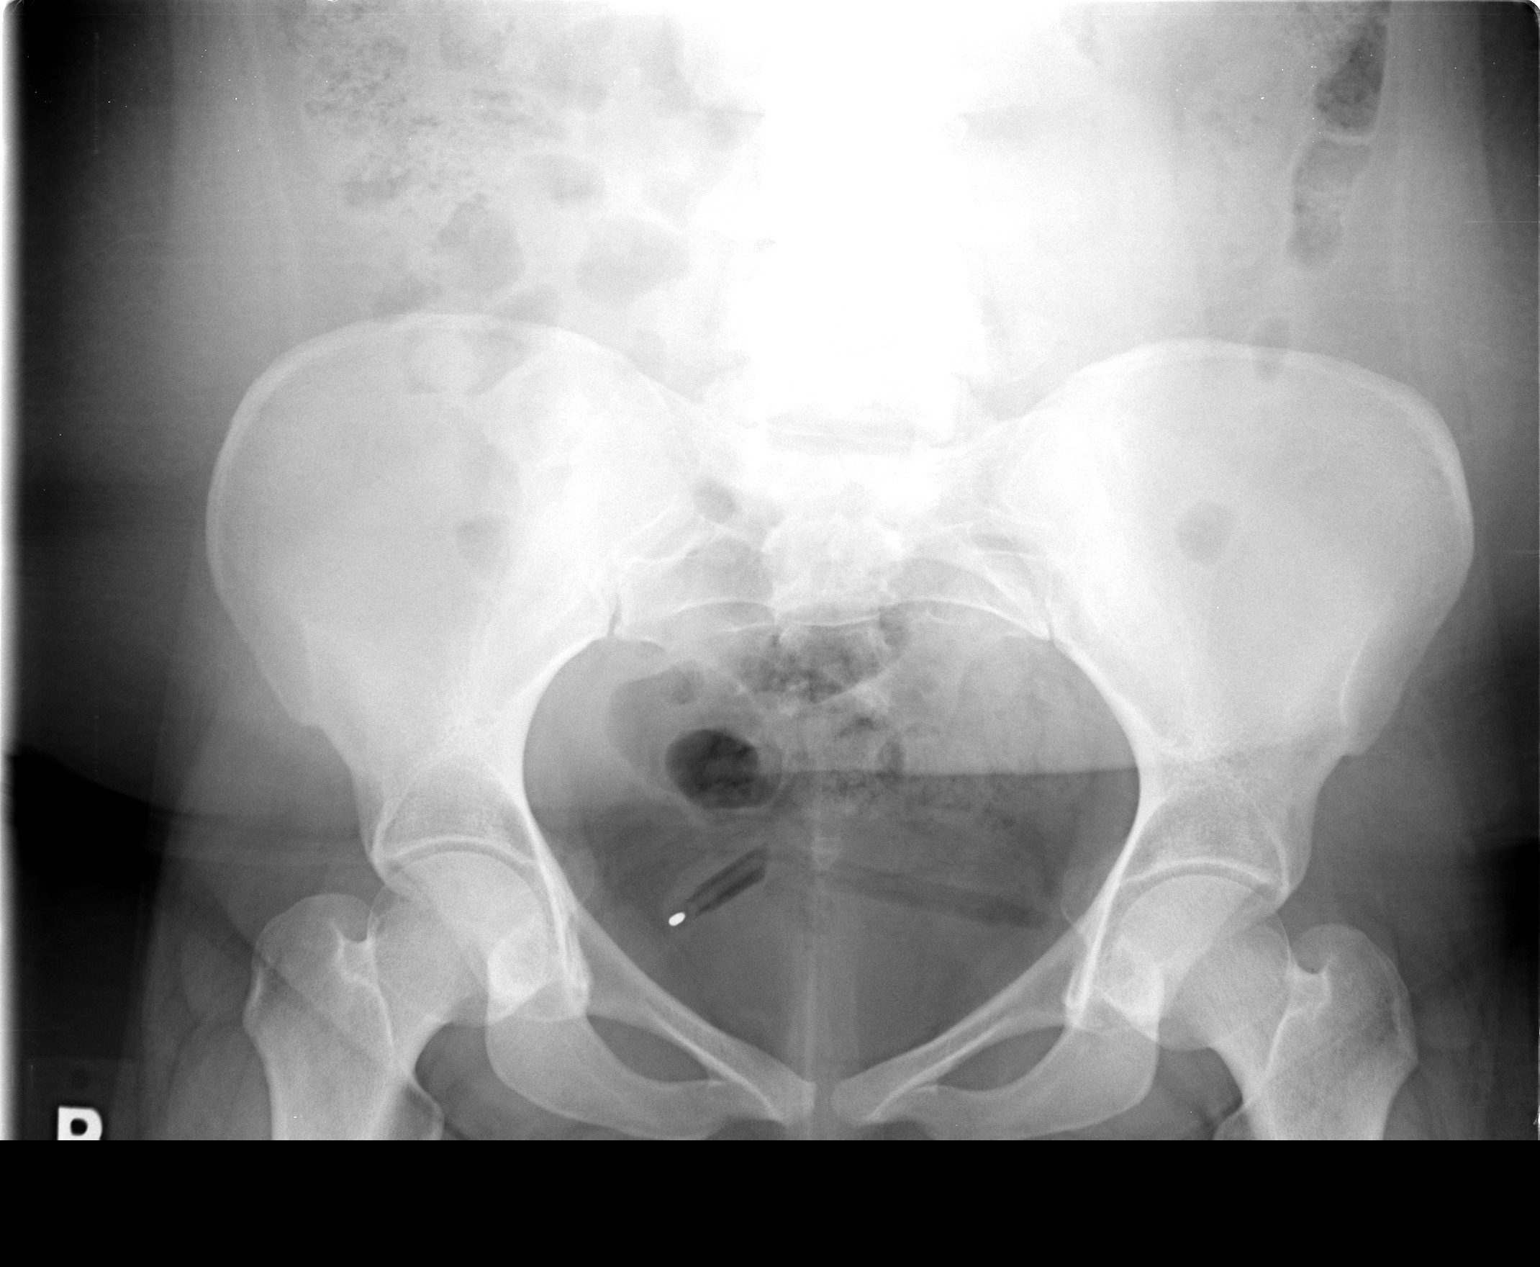

[view not recorded (2 of 2)]
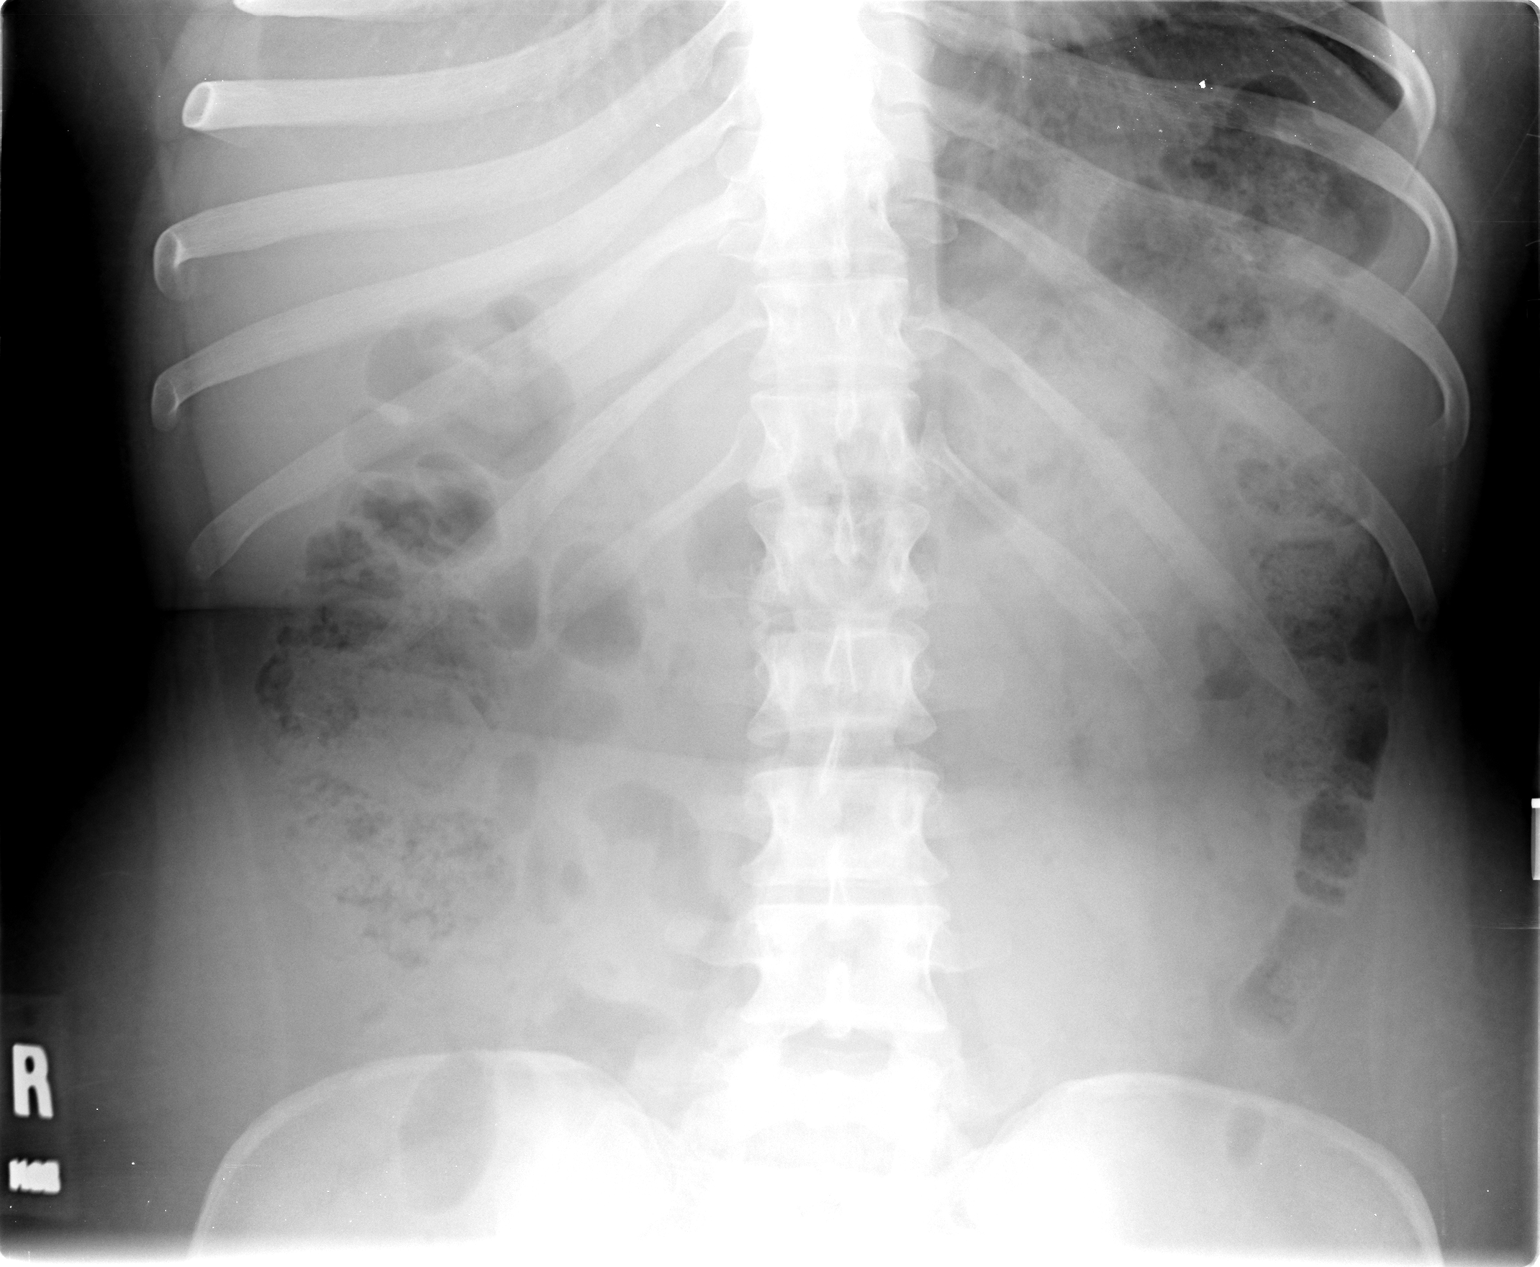

[2 of 2 positions shown; findings below may reference images not displayed]

FINDINGS: Linear radiodense and radiolucent foreign body overlying
the inferior pelvis, centered in the midline.  Normal bowel gas
pattern.  Unremarkable bones.
IMPRESSION: Ink pen in the urinary bladder.

## 2013-09-30 ENCOUNTER — Emergency Department: Payer: Self-pay | Admitting: Emergency Medicine

## 2013-10-01 ENCOUNTER — Emergency Department: Payer: Self-pay | Admitting: Emergency Medicine

## 2013-11-26 ENCOUNTER — Emergency Department: Payer: Self-pay | Admitting: Emergency Medicine

## 2013-11-26 LAB — COMPREHENSIVE METABOLIC PANEL
Albumin: 3.5 g/dL (ref 3.4–5.0)
Alkaline Phosphatase: 121 U/L — ABNORMAL HIGH
Anion Gap: 5 — ABNORMAL LOW (ref 7–16)
BILIRUBIN TOTAL: 0.1 mg/dL — AB (ref 0.2–1.0)
BUN: 13 mg/dL (ref 7–18)
CALCIUM: 9.4 mg/dL (ref 8.5–10.1)
CHLORIDE: 112 mmol/L — AB (ref 98–107)
Co2: 24 mmol/L (ref 21–32)
Creatinine: 0.87 mg/dL (ref 0.60–1.30)
EGFR (African American): >60
EGFR (Non-African Amer.): >60
GLUCOSE: 83 mg/dL (ref 65–99)
OSMOLALITY: 281 (ref 275–301)
Potassium: 3.8 mmol/L (ref 3.5–5.1)
SGOT(AST): 25 U/L (ref 15–37)
SGPT (ALT): 47 U/L (ref 12–78)
Sodium: 141 mmol/L (ref 136–145)
Total Protein: 7.8 g/dL (ref 6.4–8.2)

## 2013-11-26 LAB — CBC
HCT: 42.9 % (ref 35.0–47.0)
HGB: 13.8 g/dL (ref 12.0–16.0)
MCH: 27.3 pg (ref 26.0–34.0)
MCHC: 32.1 g/dL (ref 32.0–36.0)
MCV: 85 fL (ref 80–100)
Platelet: 241 10*3/uL (ref 150–440)
RBC: 5.05 10*6/uL (ref 3.80–5.20)
RDW: 16.1 % — AB (ref 11.5–14.5)
WBC: 9.3 10*3/uL (ref 3.6–11.0)

## 2013-11-26 LAB — DRUG SCREEN, URINE
Amphetamines, Ur Screen: NEGATIVE (ref ?–1000)
BARBITURATES, UR SCREEN: NEGATIVE (ref ?–200)
Benzodiazepine, Ur Scrn: NEGATIVE (ref ?–200)
CANNABINOID 50 NG, UR ~~LOC~~: NEGATIVE (ref ?–50)
Cocaine Metabolite,Ur ~~LOC~~: NEGATIVE (ref ?–300)
MDMA (ECSTASY) UR SCREEN: NEGATIVE (ref ?–500)
Methadone, Ur Screen: NEGATIVE (ref ?–300)
OPIATE, UR SCREEN: NEGATIVE (ref ?–300)
Phencyclidine (PCP) Ur S: NEGATIVE (ref ?–25)
TRICYCLIC, UR SCREEN: NEGATIVE (ref ?–1000)

## 2013-11-26 LAB — URINALYSIS, COMPLETE
Bacteria: NONE SEEN
Bilirubin,UR: NEGATIVE
Blood: NEGATIVE
GLUCOSE, UR: NEGATIVE mg/dL (ref 0–75)
KETONE: NEGATIVE
NITRITE: NEGATIVE
PH: 6 (ref 4.5–8.0)
PROTEIN: NEGATIVE
RBC,UR: 1 /HPF (ref 0–5)
Specific Gravity: 1.02 (ref 1.003–1.030)
Squamous Epithelial: 3

## 2013-11-26 LAB — ETHANOL: Ethanol %: <0.003 % (ref 0.000–0.080)

## 2013-11-26 LAB — SALICYLATE LEVEL: Salicylates, Serum: 3.1 mg/dL — ABNORMAL HIGH

## 2013-11-26 LAB — ACETAMINOPHEN LEVEL

## 2014-01-03 ENCOUNTER — Emergency Department: Payer: Self-pay | Admitting: Emergency Medicine

## 2014-01-03 LAB — COMPREHENSIVE METABOLIC PANEL
ALT: 40 U/L (ref 12–78)
ANION GAP: 6 — AB (ref 7–16)
Albumin: 3.5 g/dL (ref 3.4–5.0)
Alkaline Phosphatase: 118 U/L — ABNORMAL HIGH
BILIRUBIN TOTAL: 0.2 mg/dL (ref 0.2–1.0)
BUN: 14 mg/dL (ref 7–18)
CHLORIDE: 112 mmol/L — AB (ref 98–107)
CREATININE: 0.92 mg/dL (ref 0.60–1.30)
Calcium, Total: 8.5 mg/dL (ref 8.5–10.1)
Co2: 22 mmol/L (ref 21–32)
GLUCOSE: 121 mg/dL — AB (ref 65–99)
Osmolality: 281 (ref 275–301)
POTASSIUM: 3.3 mmol/L — AB (ref 3.5–5.1)
SGOT(AST): 24 U/L (ref 15–37)
Sodium: 140 mmol/L (ref 136–145)
TOTAL PROTEIN: 7.5 g/dL (ref 6.4–8.2)

## 2014-01-03 LAB — CBC
HCT: 41.7 % (ref 35.0–47.0)
HGB: 13.4 g/dL (ref 12.0–16.0)
MCH: 27.2 pg (ref 26.0–34.0)
MCHC: 32.1 g/dL (ref 32.0–36.0)
MCV: 85 fL (ref 80–100)
PLATELETS: 272 10*3/uL (ref 150–440)
RBC: 4.93 10*6/uL (ref 3.80–5.20)
RDW: 15.7 % — AB (ref 11.5–14.5)
WBC: 10.3 10*3/uL (ref 3.6–11.0)

## 2014-01-03 LAB — ACETAMINOPHEN LEVEL: Acetaminophen: <2 ug/mL

## 2014-01-03 LAB — SALICYLATE LEVEL: Salicylates, Serum: 2.4 mg/dL

## 2014-01-03 LAB — ETHANOL: Ethanol %: <0.003 % (ref 0.000–0.080)

## 2014-01-04 LAB — URINALYSIS, COMPLETE
BILIRUBIN, UR: NEGATIVE
BLOOD: NEGATIVE
Glucose,UR: NEGATIVE mg/dL (ref 0–75)
Ketone: NEGATIVE
NITRITE: NEGATIVE
PH: 7 (ref 4.5–8.0)
Protein: 30
RBC,UR: 2 /HPF (ref 0–5)
SPECIFIC GRAVITY: 1.029 (ref 1.003–1.030)
Squamous Epithelial: 16
WBC UR: 10 /HPF (ref 0–5)

## 2014-01-04 LAB — DRUG SCREEN, URINE
Amphetamines, Ur Screen: NEGATIVE (ref ?–1000)
BARBITURATES, UR SCREEN: NEGATIVE (ref ?–200)
Benzodiazepine, Ur Scrn: NEGATIVE (ref ?–200)
CANNABINOID 50 NG, UR ~~LOC~~: NEGATIVE (ref ?–50)
Cocaine Metabolite,Ur ~~LOC~~: NEGATIVE (ref ?–300)
MDMA (ECSTASY) UR SCREEN: NEGATIVE (ref ?–500)
METHADONE, UR SCREEN: NEGATIVE (ref ?–300)
OPIATE, UR SCREEN: NEGATIVE (ref ?–300)
PHENCYCLIDINE (PCP) UR S: NEGATIVE (ref ?–25)
Tricyclic, Ur Screen: NEGATIVE (ref ?–1000)

## 2014-01-04 LAB — PREGNANCY, URINE: Pregnancy Test, Urine: NEGATIVE m[IU]/mL

## 2014-02-09 ENCOUNTER — Emergency Department: Payer: Self-pay | Admitting: Emergency Medicine

## 2014-02-09 LAB — COMPREHENSIVE METABOLIC PANEL
ALBUMIN: 3.3 g/dL — AB (ref 3.4–5.0)
ALK PHOS: 97 U/L
AST: 21 U/L (ref 15–37)
Anion Gap: 10 (ref 7–16)
BILIRUBIN TOTAL: 0.4 mg/dL (ref 0.2–1.0)
BUN: 13 mg/dL (ref 7–18)
CALCIUM: 9.1 mg/dL (ref 8.5–10.1)
CHLORIDE: 110 mmol/L — AB (ref 98–107)
CO2: 21 mmol/L (ref 21–32)
CREATININE: 0.85 mg/dL (ref 0.60–1.30)
EGFR (Non-African Amer.): >60
Glucose: 72 mg/dL (ref 65–99)
OSMOLALITY: 280 (ref 275–301)
Potassium: 3.6 mmol/L (ref 3.5–5.1)
SGPT (ALT): 33 U/L (ref 12–78)
Sodium: 141 mmol/L (ref 136–145)
Total Protein: 7.2 g/dL (ref 6.4–8.2)

## 2014-02-09 LAB — URINALYSIS, COMPLETE
BACTERIA: NONE SEEN
BILIRUBIN, UR: NEGATIVE
Blood: NEGATIVE
GLUCOSE, UR: NEGATIVE mg/dL (ref 0–75)
Nitrite: NEGATIVE
PH: 5 (ref 4.5–8.0)
Protein: NEGATIVE
SPECIFIC GRAVITY: 1.03 (ref 1.003–1.030)

## 2014-02-09 LAB — CBC
HCT: 39.9 % (ref 35.0–47.0)
HGB: 12.7 g/dL (ref 12.0–16.0)
MCH: 27.1 pg (ref 26.0–34.0)
MCHC: 31.9 g/dL — ABNORMAL LOW (ref 32.0–36.0)
MCV: 85 fL (ref 80–100)
Platelet: 251 10*3/uL (ref 150–440)
RBC: 4.69 10*6/uL (ref 3.80–5.20)
RDW: 14.8 % — AB (ref 11.5–14.5)
WBC: 10.2 10*3/uL (ref 3.6–11.0)

## 2014-02-09 LAB — DRUG SCREEN, URINE
Amphetamines, Ur Screen: NEGATIVE (ref ?–1000)
Barbiturates, Ur Screen: NEGATIVE (ref ?–200)
Benzodiazepine, Ur Scrn: NEGATIVE (ref ?–200)
COCAINE METABOLITE, UR ~~LOC~~: NEGATIVE (ref ?–300)
Cannabinoid 50 Ng, Ur ~~LOC~~: POSITIVE (ref ?–50)
MDMA (Ecstasy)Ur Screen: NEGATIVE (ref ?–500)
METHADONE, UR SCREEN: NEGATIVE (ref ?–300)
Opiate, Ur Screen: NEGATIVE (ref ?–300)
PHENCYCLIDINE (PCP) UR S: NEGATIVE (ref ?–25)
Tricyclic, Ur Screen: NEGATIVE (ref ?–1000)

## 2014-02-09 LAB — ACETAMINOPHEN LEVEL

## 2014-02-09 LAB — SALICYLATE LEVEL: SALICYLATES, SERUM: 2 mg/dL

## 2014-02-09 LAB — ETHANOL
Ethanol %: <0.003 % (ref 0.000–0.080)
Ethanol: <3 mg/dL

## 2014-02-11 ENCOUNTER — Inpatient Hospital Stay: Payer: Self-pay | Admitting: Internal Medicine

## 2014-02-11 LAB — DRUG SCREEN, URINE
AMPHETAMINES, UR SCREEN: NEGATIVE (ref ?–1000)
BARBITURATES, UR SCREEN: NEGATIVE (ref ?–200)
Benzodiazepine, Ur Scrn: NEGATIVE (ref ?–200)
CANNABINOID 50 NG, UR ~~LOC~~: NEGATIVE (ref ?–50)
Cocaine Metabolite,Ur ~~LOC~~: NEGATIVE (ref ?–300)
MDMA (ECSTASY) UR SCREEN: NEGATIVE (ref ?–500)
Methadone, Ur Screen: NEGATIVE (ref ?–300)
Opiate, Ur Screen: NEGATIVE (ref ?–300)
Phencyclidine (PCP) Ur S: NEGATIVE (ref ?–25)
Tricyclic, Ur Screen: NEGATIVE (ref ?–1000)

## 2014-02-11 LAB — COMPREHENSIVE METABOLIC PANEL
ALBUMIN: 3.7 g/dL (ref 3.4–5.0)
ALK PHOS: 105 U/L
AST: 25 U/L (ref 15–37)
Anion Gap: 12 (ref 7–16)
BUN: 9 mg/dL (ref 7–18)
Bilirubin,Total: 0.3 mg/dL (ref 0.2–1.0)
CALCIUM: 9.5 mg/dL (ref 8.5–10.1)
CREATININE: 0.76 mg/dL (ref 0.60–1.30)
Chloride: 111 mmol/L — ABNORMAL HIGH (ref 98–107)
Co2: 19 mmol/L — ABNORMAL LOW (ref 21–32)
EGFR (African American): >60
EGFR (Non-African Amer.): >60
GLUCOSE: 121 mg/dL — AB (ref 65–99)
OSMOLALITY: 283 (ref 275–301)
Potassium: 3.4 mmol/L — ABNORMAL LOW (ref 3.5–5.1)
SGPT (ALT): 37 U/L (ref 12–78)
Sodium: 142 mmol/L (ref 136–145)
TOTAL PROTEIN: 8 g/dL (ref 6.4–8.2)

## 2014-02-11 LAB — URINALYSIS, COMPLETE
BILIRUBIN, UR: NEGATIVE
Bacteria: NONE SEEN
Blood: NEGATIVE
Glucose,UR: NEGATIVE mg/dL (ref 0–75)
Hyaline Cast: 3
Leukocyte Esterase: NEGATIVE
NITRITE: NEGATIVE
PH: 5 (ref 4.5–8.0)
Protein: 30
RBC,UR: NONE SEEN /HPF (ref 0–5)
Specific Gravity: 1.047 (ref 1.003–1.030)
Squamous Epithelial: 8
WBC UR: NONE SEEN /HPF (ref 0–5)

## 2014-02-11 LAB — ACETAMINOPHEN LEVEL: Acetaminophen: 416 ug/mL

## 2014-02-11 LAB — CBC
HCT: 42.9 % (ref 35.0–47.0)
HGB: 13.7 g/dL (ref 12.0–16.0)
MCH: 27.1 pg (ref 26.0–34.0)
MCHC: 31.9 g/dL — ABNORMAL LOW (ref 32.0–36.0)
MCV: 85 fL (ref 80–100)
PLATELETS: 302 10*3/uL (ref 150–440)
RBC: 5.05 10*6/uL (ref 3.80–5.20)
RDW: 15.1 % — AB (ref 11.5–14.5)
WBC: 11.8 10*3/uL — AB (ref 3.6–11.0)

## 2014-02-11 LAB — ETHANOL

## 2014-02-11 LAB — SALICYLATE LEVEL: SALICYLATES, SERUM: 2.6 mg/dL

## 2014-02-12 LAB — CBC WITH DIFFERENTIAL/PLATELET
Basophil #: 0.1 10*3/uL (ref 0.0–0.1)
Basophil %: 0.8 %
EOS ABS: 0 10*3/uL (ref 0.0–0.7)
Eosinophil %: 0 %
HCT: 42 % (ref 35.0–47.0)
HGB: 13.4 g/dL (ref 12.0–16.0)
LYMPHS ABS: 1.4 10*3/uL (ref 1.0–3.6)
Lymphocyte %: 15.5 %
MCH: 27.1 pg (ref 26.0–34.0)
MCHC: 31.8 g/dL — ABNORMAL LOW (ref 32.0–36.0)
MCV: 85 fL (ref 80–100)
Monocyte #: 0.3 x10 3/mm (ref 0.2–0.9)
Monocyte %: 3.8 %
Neutrophil #: 7.2 10*3/uL — ABNORMAL HIGH (ref 1.4–6.5)
Neutrophil %: 79.9 %
Platelet: 274 10*3/uL (ref 150–440)
RBC: 4.93 10*6/uL (ref 3.80–5.20)
RDW: 15.2 % — ABNORMAL HIGH (ref 11.5–14.5)
WBC: 9 10*3/uL (ref 3.6–11.0)

## 2014-02-12 LAB — COMPREHENSIVE METABOLIC PANEL
ALBUMIN: 3 g/dL — AB (ref 3.4–5.0)
Alkaline Phosphatase: 87 U/L
Anion Gap: 12 (ref 7–16)
BUN: 10 mg/dL (ref 7–18)
Bilirubin,Total: 0.3 mg/dL (ref 0.2–1.0)
Calcium, Total: 8.2 mg/dL — ABNORMAL LOW (ref 8.5–10.1)
Chloride: 111 mmol/L — ABNORMAL HIGH (ref 98–107)
Co2: 20 mmol/L — ABNORMAL LOW (ref 21–32)
Creatinine: 0.59 mg/dL — ABNORMAL LOW (ref 0.60–1.30)
EGFR (African American): >60
EGFR (Non-African Amer.): >60
Glucose: 118 mg/dL — ABNORMAL HIGH (ref 65–99)
OSMOLALITY: 285 (ref 275–301)
Potassium: 3.5 mmol/L (ref 3.5–5.1)
SGOT(AST): 26 U/L (ref 15–37)
SGPT (ALT): 32 U/L (ref 12–78)
Sodium: 143 mmol/L (ref 136–145)
TOTAL PROTEIN: 6.9 g/dL (ref 6.4–8.2)

## 2014-02-12 LAB — HEPATIC FUNCTION PANEL A (ARMC)
ALT: 36 U/L (ref 12–78)
Albumin: 2.8 g/dL — ABNORMAL LOW (ref 3.4–5.0)
Alkaline Phosphatase: 86 U/L
Bilirubin,Total: 0.2 mg/dL (ref 0.2–1.0)
SGOT(AST): 15 U/L (ref 15–37)
Total Protein: 6.7 g/dL (ref 6.4–8.2)

## 2014-02-12 LAB — MAGNESIUM: Magnesium: 1.9 mg/dL

## 2014-02-12 LAB — PROTIME-INR
INR: 1.2
Prothrombin Time: 14.7 secs (ref 11.5–14.7)

## 2014-02-12 LAB — ACETAMINOPHEN LEVEL: Acetaminophen: 70 ug/mL — ABNORMAL HIGH

## 2014-02-12 LAB — TSH: Thyroid Stimulating Horm: 0.756 u[IU]/mL

## 2014-02-13 ENCOUNTER — Inpatient Hospital Stay: Payer: Self-pay | Admitting: Psychiatry

## 2014-02-13 LAB — ACETAMINOPHEN LEVEL

## 2014-02-19 LAB — LIPID PANEL
Cholesterol: 184 mg/dL (ref 0–200)
HDL Cholesterol: 35 mg/dL — ABNORMAL LOW (ref 40–60)
Ldl Cholesterol, Calc: 111 mg/dL — ABNORMAL HIGH (ref 0–100)
TRIGLYCERIDES: 190 mg/dL (ref 0–200)
VLDL Cholesterol, Calc: 38 mg/dL (ref 5–40)

## 2014-02-19 LAB — HEMOGLOBIN A1C: Hemoglobin A1C: 6.3 % (ref 4.2–6.3)

## 2014-03-24 ENCOUNTER — Emergency Department: Payer: Self-pay | Admitting: Emergency Medicine

## 2014-03-24 LAB — DRUG SCREEN, URINE
AMPHETAMINES, UR SCREEN: NEGATIVE (ref ?–1000)
BARBITURATES, UR SCREEN: NEGATIVE (ref ?–200)
Benzodiazepine, Ur Scrn: NEGATIVE (ref ?–200)
Cannabinoid 50 Ng, Ur ~~LOC~~: NEGATIVE (ref ?–50)
Cocaine Metabolite,Ur ~~LOC~~: NEGATIVE (ref ?–300)
MDMA (ECSTASY) UR SCREEN: NEGATIVE (ref ?–500)
METHADONE, UR SCREEN: NEGATIVE (ref ?–300)
OPIATE, UR SCREEN: NEGATIVE (ref ?–300)
PHENCYCLIDINE (PCP) UR S: NEGATIVE (ref ?–25)
TRICYCLIC, UR SCREEN: NEGATIVE (ref ?–1000)

## 2014-03-24 LAB — COMPREHENSIVE METABOLIC PANEL
ALT: 28 U/L (ref 12–78)
Albumin: 3.3 g/dL — ABNORMAL LOW (ref 3.4–5.0)
Alkaline Phosphatase: 97 U/L
Anion Gap: 4 — ABNORMAL LOW (ref 7–16)
BILIRUBIN TOTAL: 0.1 mg/dL — AB (ref 0.2–1.0)
BUN: 14 mg/dL (ref 7–18)
CALCIUM: 9 mg/dL (ref 8.5–10.1)
CHLORIDE: 114 mmol/L — AB (ref 98–107)
Co2: 23 mmol/L (ref 21–32)
Creatinine: 0.75 mg/dL (ref 0.60–1.30)
EGFR (Non-African Amer.): >60
Glucose: 92 mg/dL (ref 65–99)
Osmolality: 281 (ref 275–301)
POTASSIUM: 3.9 mmol/L (ref 3.5–5.1)
SGOT(AST): 22 U/L (ref 15–37)
SODIUM: 141 mmol/L (ref 136–145)
Total Protein: 7.4 g/dL (ref 6.4–8.2)

## 2014-03-24 LAB — ETHANOL
ETHANOL %: 0.003 % (ref 0.000–0.080)
Ethanol: 3 mg/dL

## 2014-03-24 LAB — CBC
HCT: 39.5 % (ref 35.0–47.0)
HGB: 12.5 g/dL (ref 12.0–16.0)
MCH: 27.7 pg (ref 26.0–34.0)
MCHC: 31.7 g/dL — ABNORMAL LOW (ref 32.0–36.0)
MCV: 87 fL (ref 80–100)
PLATELETS: 299 10*3/uL (ref 150–440)
RBC: 4.52 10*6/uL (ref 3.80–5.20)
RDW: 15.1 % — ABNORMAL HIGH (ref 11.5–14.5)
WBC: 10.2 10*3/uL (ref 3.6–11.0)

## 2014-03-24 LAB — ACETAMINOPHEN LEVEL: Acetaminophen: <2 ug/mL

## 2014-03-24 LAB — SALICYLATE LEVEL: Salicylates, Serum: 3.1 mg/dL — ABNORMAL HIGH

## 2014-03-26 LAB — URINALYSIS, COMPLETE
Bacteria: NONE SEEN
Bilirubin,UR: NEGATIVE
Blood: NEGATIVE
GLUCOSE, UR: NEGATIVE mg/dL (ref 0–75)
Ketone: NEGATIVE
Leukocyte Esterase: NEGATIVE
NITRITE: NEGATIVE
Ph: 7 (ref 4.5–8.0)
Protein: NEGATIVE
RBC,UR: <1 /HPF (ref 0–5)
Specific Gravity: 1.013 (ref 1.003–1.030)
Squamous Epithelial: 2

## 2014-03-26 LAB — PREGNANCY, URINE: PREGNANCY TEST, URINE: NEGATIVE m[IU]/mL

## 2014-04-04 LAB — PATHOLOGY REPORT

## 2015-02-03 NOTE — Consult Note (Signed)
PATIENT NAME:  Cynthia Phelps, Cynthia Phelps MR#:  [id number] DATE OF BIRTH:  1991-06-15  DATE OF CONSULTATION:  01/04/2014  CONSULTING PHYSICIAN:  Audery AmelJohn T. Stclair Szymborski, MD  IDENTIFYING INFORMATION AND REASON FOR CONSULT: A 24 year old woman came into the Emergency Room under involuntary commitment having cut herself on the wrist.   CHIEF COMPLAINT: "It was more or less an impulsive thing."   HISTORY OF PRESENT ILLNESS: Information obtained from the patient and the chart. The patient states that yesterday she was on the phone talking with her father and got into an argument. She perceived that he was blaming her for the problem she was having with her roommate. This got her upset and she impulsively grabbed a scrap of metal, apparently the lid of a can and cut herself on the left wrist. Right after it happened, she let the staff know at the group home and cooperated with coming into the hospital. The patient states that prior to this, her overall mood had been about the same as usual. Had not been having any suicidal thoughts. Had not been having any psychotic symptoms. She had been compliant with her current medication. She goes to [facility] every day and thinks it is an effective treatment for her. She got impulsively upset at her roommate over minor day-to-day issues, which has been a chronic problem for her. At this point, the patient is denying having any suicidal ideation. Says her mood is feeling fine. No psychotic symptoms.   PAST PSYCHIATRIC HISTORY: The patient has a long history of borderline personality disorder, multiple episodes of self-mutilation going back years. She has been living in our area for the last few years but has out-of-area Medicaid. This crazy situation has resulted in her not being able to get an actual psychiatric provider in the area. Instead, she is getting treatment from her primary care doctor and going to [facility]. She does have a past history of hospitalizations and was in our  hospital in 2012. She been on several medicines in the past but thinks her current combination of Celexa and Topamax is reasonably helpful.   PAST MEDICAL HISTORY: Has asthma. Overweight. Dyslipidemia. No other significant ongoing medical problems.   SOCIAL HISTORY: Lives in a group home. Her father is her legal guardian. Has been in her current group home about 3-1/2 months. Thinks that she is starting to get use to it.   SUBSTANCE ABUSE HISTORY:  Denies any alcohol or drug abuse. Currently denies any past dependence problems.   CURRENT MEDICATIONS: Citalopram 20 mg per day, Topamax 100 mg in the morning. Spiriva inhaler 18 mcg per day, Depo-Provera every 3 months, Ventolin inhaler as needed, Prilosec 20 mg per day, Lipitor 10 mg per day.   ALLERGIES: DOXYCYCLINE, HALOPERIDOL, SEROQUEL, TETRACYCLINE.   REVIEW OF SYSTEMS: Currently denies depression. Denies suicidal ideation. Denies homicidal ideation. Denies hallucinations. Sleeps adequately. Energy level normal. Minor pain where her wrist was glued, but she is not complaining of it. The rest of the full 10-point review of systems is negative.   MENTAL STATUS EXAMINATION: Heavyset, cooperative, polite woman looks her stated age, cooperative with the interview. Good eye contact. Normal psychomotor activity. Speech normal rate, tone and volume. Affect euthymic, reactive, appropriate. Smiling and upbeat overall. Not inappropriately so, however, understands the situation has been through it before. Mood stated as fine. Thoughts are lucid without loosening of associations. Denies auditory or visual hallucinations. Denies suicidal or homicidal ideation. Shows adequate judgment and insight right now. Normal fund of  knowledge. Short and long-term memory intact. Alert and oriented x 4.   PHYSICAL EXAMINATION: GENERAL: The most relevance thing is that she has a laceration at sort of an angle across her left wrist. It has been dressed and looks like it was  patched up with skin glue. Has adequate medical treatment.  VITAL SIGNS: Currently, blood pressure 131/78, respirations 18, pulse 78, temperature 98.1.   LABORATORY RESULTS: Urinalysis looks to me like it is possible urinary tract infection. Pregnancy test negative. Drug test negative. Alcohol negative.   ASSESSMENT: A 24 year old woman with borderline personality disorder, cut her wrist. Now back to her baseline mental state. Receives adequate, under the circumstances, outpatient treatment. No indication for inpatient hospitalization. Unlikely to benefit her and she is probably no more acutely dangerous than she is at her usual baseline. The patient shows good insight under the circumstances and agrees to appropriate outpatient treatment. No longer meets commitment criteria.   TREATMENT PLAN: Reviewed with the patient her outpatient treatment. I agreed with her that the current situation with her Medicaid puts her in a difficult situation and I wished her well with getting better psychiatric followup. Her group home manager is working hard on that. Continue current medicines, otherwise continue going to the local [facility]. I spoke with the Emergency Room doctor and took the patient off IVC and she can be discharged home.   DIAGNOSIS, PRINCIPAL AND PRIMARY:  AXIS II:  Borderline personality disorder.   SECONDARY DIAGNOSES: AXIS I:   No further.  AXIS II:  No further.  AXIS III: Self-inflicted laceration to the left wrist. Did not do any major damage, sutured, obesity, dyslipidemia, asthma.  AXIS IV: Moderate chronic from her illness.  AXIS V:  Functioning at time of evaluation is 55.   ____________________________ Audery Amel, MD jtc:ce D: 01/04/2014 15:43:43 ET T: 01/04/2014 19:16:19 ET JOB#: [id number]  cc: Audery Amel, MD, <Dictator> Audery Amel MD ELECTRONICALLY SIGNED 01/04/2014 23:46

## 2015-02-03 NOTE — Consult Note (Signed)
PATIENT NAME:  Cynthia Phelps, Cynthia Phelps MR#:  [id number] DATE OF BIRTH:  12-14-1990  DATE OF CONSULTATION:  03/30/2014  REFERRING PHYSICIAN:  Dr. Mayford KnifeWilliams  CONSULTING PHYSICIAN:  Suszanne ConnersMichael R. Evelene CroonWolff, MD  REASON FOR CONSULTATION: Foreign body in the bladder.    HISTORY OF PRESENT ILLNESS: [name] is a 24 year old Caucasian female who opened up a TV remote controller and pulled out an AAA battery and inserted it into her bladder this evening. This was confirmed with KUB. She was admitted for psychiatric issues. She has a long psychiatric history and has had 2 prior episodes with placement of foreign bodies into her bladder. She placed a fingernail clipper in 2011 and again in 2012 which were removed. The current foreign object is the largest object she has ever placed into her bladder.   MEDICATIONS: The patient is currently taking Topamax and Celexa.   ALLERGIES: DOXYCYCLINE, HALDOL, SEROQUEL AND TETRACYCLINE.   PREVIOUS SURGICAL PROCEDURES: Include removal of a nail clipper from the bladder in 2011 and 2012.   SOCIAL HISTORY: She smokes a pack a day and consumes alcohol heavily.   PAST AND CURRENT MEDICAL CONDITIONS:  1. Diabetes.  2. COPD.  3. Depression with history of suicide attempts.  4. Borderline personality disorder.  REVIEW OF SYSTEMS: The patient denied a history of heart disease, stroke or hypertension.   FAMILY HISTORY: Noncontributory.   PHYSICAL EXAMINATION:  GENERAL: Obese Cynthia Phelps female in no acute distress.  HEENT: Sclerae were clear. Pupils were equally round, reactive to light and accommodation.  NECK: Supple. No palpable cervical lymphadenopathy.  LUNGS: Clear to auscultation.  CARDIOVASCULAR: Regular rhythm and rate without audible murmurs.  ABDOMEN: Soft abdomen with normal bowel sounds.  GENITOURINARY AND RECTAL: Deferred.  NEUROMUSCULAR: Alert and oriented x 3.   RADIOLOGY: A KUB performed earlier this evening was reviewed.   IMPRESSION: Foreign body in the bladder.    PLAN: Surgical removal of foreign body.   ____________________________ Suszanne ConnersMichael R. Evelene CroonWolff, MD mrw:gb D: 03/30/2014 22:56:15 ET T: 03/30/2014 23:24:05 ET JOB#: [id number]  cc: Suszanne ConnersMichael R. Evelene CroonWolff, MD, <Dictator> Orson ApeMICHAEL R WOLFF MD ELECTRONICALLY SIGNED 03/31/2014 5:33

## 2015-02-03 NOTE — Op Note (Signed)
PATIENT NAME:  Cynthia GardenerWHITE, [name] MR#:  [id number] DATE OF BIRTH:  December 08, 1990  DATE OF PROCEDURE:  03/31/2014  PREOPERATIVE DIAGNOSIS:  Foreign body in the bladder.   POSTOPERATIVE DIAGNOSES:  Foreign body in the bladder.  PROCEDURES:  Endoscopic removal of foreign body in the bladder.   SURGEON:  Anola GurneyMichael Wolff, M.D.   ANESTHETIST:  Linna Hoffarroll and Wolff.   ANESTHETIC METHOD:  General per Noralyn Pickarroll, local per Dr. Evelene CroonWolff.   INDICATIONS:  See the dictated consultation.  After informed consent, the patient requests the above procedure.   OPERATIVE SUMMARY:  After adequate general anesthesia had been obtained, the patient was placed into dorsal lithotomy position and the perineum was prepped and draped in the usual fashion.  The urethra was dilated up to a 4532 [place] with the female dilators.  The rigid nephroscope sheath was then advanced into the bladder with the obturator in place.  Nephroscope was then placed into the sheath and the bladder was inspected.  The bladder mucosa was denuded in a few locations.  The patient had a triple-A battery present in the bladder.  After multiple attempts, the battery was engaged with the three prong graspers and pulled out of the bladder through the urethra.  The nephroscope was also removed.  At this point, 10 mL of viscous Xylocaine was instilled within the urethra and the bladder.  A 20 French silicone catheter was placed.  Catheter had clear drainage.  Procedure was then terminated and the patient was transferred to the recovery room in stable condition.      ____________________________ Suszanne ConnersMichael R. Evelene CroonWolff, MD mrw:ea D: 03/31/2014 01:08:33 ET T: 03/31/2014 06:48:42 ET JOB#: [id number]  cc: Suszanne ConnersMichael R. Evelene CroonWolff, MD, <Dictator> Orson ApeMICHAEL R WOLFF MD ELECTRONICALLY SIGNED 03/31/2014 9:06

## 2015-02-03 NOTE — Consult Note (Signed)
Brief Consult Note: Diagnosis: Bipolar Disorder NOS, Borderline personality disorder.   Patient was seen by consultant.   Discussed with Attending MD.   Comments: Pt seen in ED BHU. she has long h/o  mood instability and substance abuse. She left her group home for alcohol use and was unable to return. She is awaiting PASSR and will need placement after the same. Stated that she was sleeping well with help of Trazodone and wants the dose to be increased. She denied Si/HI or plans. No behavioral problems noted.    PLAN: Pt is on IVC I will titrate Tradozone 100mg  po qhs.  She will continue other meds as prescribed.  Will continue to monitor.  Electronic Signatures: Rhunette CroftFaheem, Uzma S (MD)  (Signed 18-Jun-15 10:24)  Authored: Brief Consult Note   Last Updated: 18-Jun-15 10:24 by Rhunette CroftFaheem, Uzma S (MD)

## 2015-02-03 NOTE — H&P (Signed)
PATIENT NAME:  Cynthia GardenerWHITE, [name] MR#:  [id number] DATE OF BIRTH:  Feb 08, 1991  DATE OF ADMISSION:  02/11/2014  PRIMARY CARE PHYSICIAN: Dr. Evelene CroonMeindert Niemeyer.   REFERRING PHYSICIAN: Dr. Jene Everyobert Kinner.   CHIEF COMPLAINT: Acetaminophen overdose.   HISTORY OF PRESENT ILLNESS: [name] is a 24 year old morbidly obese, [place] female with a history of depression with multiple suicidal attempts, was here in the behavioral health on April 30 for a laceration of the arm. Tonight the patient took 500 mg of acetaminophen of 125 tablets and came to the emergency department within an hour. The patient was given charcoal. Initial Tylenol level was 416. The patient was initiated on Mucomyst after discussing with poison control. The patient has a normal LFT. The patient had a few episodes of vomiting in the Emergency Department.   PAST MEDICAL HISTORY:  1. Depression with multiple suicidal attempts.  2. Hyperlipidemia.  3. Gastroesophageal reflux disease.  4. Asthma. 5. Borderline personality.  PAST SURGICAL HISTORY: .   ALLERGIES:  1. DOXYCYCLINE. 2. TETRACYCLINE. 3. HALOPERIDOL. 4. SEROQUEL.   HOME MEDICATIONS: 1. Ventolin 2 puffs every 4 hours as needed. 2. Tylenol Extra Strength 2 tablets every 6 hours as needed. 3. Topamax 100 mg once a day 4. Spiriva 18 mcg every 24 hours. 5. Robitussin 10 mL 3 times a day as needed.  6. Prilosec 20 mg once a day.  7. Neosporin, topical application. 8. MiraLax as needed.  9. Lipitor 10 mg once a daily. 10. Celexa 20 mg 2 tablets once a day.   SOCIAL HISTORY: Continues to smoke 1 pack a day. Denies drinking alcohol or using illicit drugs. Lives in a group home.   FAMILY HISTORY: Patient denies any health problems.   REVIEW OF SYSTEMS: CONSTITUTIONAL: Denies any generalized weakness. HEENT: Eyes: No change in vision. ENT: No change in hearing. RESPIRATORY: No cough, shortness of breath.  CARDIOVASCULAR: No Chest pain, palpitation.  GASTROINTESTINAL: No  nausea, vomiting, abdominal pain. GENITOURINARY: No dysuria or hematuria.  ENDOCRINE: No polyuria or polydipsia.  HEMATOLOGIC: No easy bruising or bleeding.  SKIN: No rash or lesions.  MUSCULOSKELETAL: No joint pains and aches. NEUROLOGIC: No weakness or numbness in any part of the body.   PHYSICAL EXAMINATION:  GENERAL: This is a well-built, well-nourished, age-appropriate female laying down in the bed, not in distress.  VITAL SIGNS: Temperature 97.6, pulse 48, blood pressure 119/70, respiratory rate of 16, oxygen saturation is 99% on room air.  HEENT: Head normocephalic, atraumatic. Eyes, no scleral icterus. Conjunctivae normal. Pupils equal and reactive. Extraocular movements are intact. Mucous membranes moist. No pharyngeal erythema.  NECK: Supple. No lymphadenopathy. No JVD, no carotid bruits. CHEST: Bilateral clear to auscultation.  HEART: S1, S2, regular, no murmurs are heard.  ABDOMEN: Bowel sounds present. Soft, nontender, nondistended. Could not appreciate any hepatosplenomegaly.  EXTREMITIES: No pedal edema. Pulses 2+. NEUROLOGIC: The patient is alert, oriented to place, person, and time. Cranial nerves II-XII intact. Motor 5/5 in upper and lower extremities.  SKIN: No rash or lesions.  MUSCULOSKELETAL: Good range of motion in all extremities.  PSYCHIATRIC: Depressed mood.   LABORATORIES: Acetaminophen level 416, salicylates 2. CBC: WBC 11.8, hemoglobin 13.7, platelet count of 302,000. CMP: Potassium 3.4. The rest of the values are within normal limits.  UA negative for nitrites and leukocyte esterase. Urine drug screen is negative.   ASSESSMENT AND PLAN: [name] is a 24 year old female who comes to the Emergency Department after taking high dose acetaminophen multiple pills.  1. Acetaminophen poisoning  with elevated acetaminophen levels within an hour. Continue with Mucomyst. Was given bolus in the Emergency Department. Continue the drip. Continue to follow up with  acetaminophen levels every 4 hours. Check complete metabolic panel in the morning as well as coag profile. 2. Depression with a suicide ideation. Keep the patient on the suicide precautions.  3. Consult psychiatry. Keep the patient on suicidal precautions.  4. Morbid obesity. Will need further counseling.  5. Hyperlipidemia. We will hold the Lipitor.  6. Keep the patient on deep vein thrombosis prophylaxis with Lovenox.   TIME SPENT: 50 minutes.    ____________________________ Susa Griffins, MD pv:lt D: 02/12/2014 04:24:20 ET T: 02/12/2014 06:12:40 ET JOB#: [id number]  cc: Susa Griffins, MD, <Dictator> Susa Griffins MD ELECTRONICALLY SIGNED 02/13/2014 21:04

## 2015-02-03 NOTE — Consult Note (Signed)
PATIENT NAME:  Cynthia Phelps, Cynthia Phelps MR#:  [id number] DATE OF BIRTH:  Oct 07, 1991  DATE OF CONSULTATION:  02/19/2014  REFERRING PHYSICIAN:    CONSULTING PHYSICIAN:  Judyann Casasola Phelps. Cherlynn KaiserSainani, MD  PRIMARY CARE PHYSICIAN: She does not have one.   REASON FOR CONSULTATION: Hyperglycemia.   HISTORY OF PRESENT ILLNESS: This is a 24 year old female who was admitted to Behavioral Medicine due to a suicide attempt secondary to Tylenol overdose. Hospitalist services were contacted as the patient has been having slightly elevated blood sugars. The patient clinically denies any acute symptoms. She does admit to some mild increased thirst and maybe some mild polyuria while she has been here in the hospital. She denies any chest pain, shortness of breath, nausea or vomiting, abdominal pain or any other associated symptoms presently.   REVIEW OF SYSTEMS:  CONSTITUTIONAL: No documented fever. No weight gain or weight loss.  EYES: No blurry or double vision.  EARS, NOSE, THROAT: No tinnitus. No postnasal drip. No redness of the oropharynx.  RESPIRATORY: No cough. No wheeze. No hemoptysis. No dyspnea.  CARDIOVASCULAR: No chest pain. No orthopnea. No palpitations or syncope.  GASTROINTESTINAL: No nausea. No vomiting or diarrhea. No abdominal pain. No melena or hematochezia.  GENITOURINARY: No dysuria or hematuria.  ENDOCRINE: Positive polyuria. No nocturia. No heat or cold intolerance.  HEMATOLOGIC: No anemia. No bruising. No bleeding.  INTEGUMENTARY: No rashes. No lesions.  MUSCULOSKELETAL: No arthritis. No swelling or gout.  NEUROLOGIC: No numbness or tingling. No ataxia. No seizure activity.  PSYCHIATRIC: Positive depression. No anxiety. No ADD.  PAST MEDICAL HISTORY: Consistent with obesity. Glucose intolerance.  Depression with multiple suicide attempts.   ALLERGIES: DOXYCYCLINE AND TETRACYCLINE, WHICH CAUSES A RASH.   SOCIAL HISTORY: Does smoke about a pack per day, has been smoking for the past 6 to 7 years.  Occasional alcohol use. No illicit drug abuse.   FAMILY HISTORY: Mother and father are both alive and no family history of diabetes.   CURRENT MEDICATIONS: Presently here are as follows: Tylenol 650 as needed, albuterol inhaler as needed, Celexa 40 mg daily, Spiriva 1 puff daily, nicotine patch 14 mg transdermally daily, Topamax 50 mg b.i.d., Latuda 40 mg daily and Remeron 30 mg at bedtime.   PHYSICAL EXAMINATION: Presently is as follows:  VITAL SIGNS: Temperature 98.7, pulse 80, respirations 22, blood pressure 117/74, sats are 99% on room air.  GENERAL: She is a pleasant-appearing female, but in no apparent distress.  HEAD, EYES, EARS, NOSE, THROAT: Atraumatic, normocephalic. Extraocular muscles are intact. Pupils equal and reactive to light. Sclerae anicteric. No conjunctival injection. No pharyngeal erythema.  NECK: Supple. There is no jugular venous distention, no bruits, no lymphadenopathy or thyromegaly.  HEART: Regular rate and rhythm. No murmurs. No rubs or clicks.  LUNGS: Clear to auscultation bilaterally. No rales or rhonchi. No wheezes.  ABDOMEN: Soft, flat, nontender, nondistended. Has good bowel sounds. No hepatosplenomegaly appreciated.  EXTREMITIES: No evidence of any cyanosis, clubbing or peripheral edema. Has +2 pedal and radial pulses bilaterally.  NEUROLOGICAL: The patient is alert, awake, oriented x3 with no focal motor or sensory deficits appreciated bilaterally.  SKIN: Moist and warm with no rashes appreciated.  LYMPHATIC: There is no cervical or axillary lymphadenopathy.   LABORATORY DATA: Serum glucose of 300 at 8:00 last night and 202 at 4:00 yesterday evening.   ASSESSMENT AND PLAN: This is a 24 year old female with history of obesity, asthma, history of depression with multiple suicide attempts, who presents to the hospital due to Tylenol overdose  in a suicide attempt and noted to be hyperglycemic.  1. Hyperglycemia/glucose intolerance. This is likely because the  patient's blood sugars have been labile. Her hemoglobin A1c although is stable. At this point, I would place her on a carbohydrate-controlled diet and suggest weight loss and follow her blood sugars. We do not need to check her blood sugars between meals and at bedtime. She does not need any sliding scale insulin coverage. I advised her to stopped drinking any sugary drinks including sodas or any juices at this time. We can get social work to get her a glucometer as an outpatient and she can check her blood sugars daily. She likely will also benefit from seeing a primary care physician upon discharge from psychiatry.  2. Moods disorder/borderline personality disorder. Continue care as per psychiatry.  3. Asthma/chronic obstructive pulmonary disease - no acute exacerbation. Continue with her Spiriva and albuterol inhaler as needed.   Thank you so much for the consultation. Will sign off for now. Call back if any further help is needed.  TIME SPENT: 45 minutes    ____________________________ Rolly Pancake. Cherlynn Kaiser, MD vjs:st D: 02/19/2014 13:47:12 ET T: 02/19/2014 17:00:13 ET JOB#: [id number]  cc: Rolly Pancake. Cherlynn Kaiser, MD, <Dictator> Houston Siren MD ELECTRONICALLY SIGNED 02/22/2014 14:05

## 2015-02-03 NOTE — Consult Note (Signed)
Brief Consult Note: Diagnosis: borderline personality disorder.   Patient was seen by consultant.   Consult note dictated.   Orders entered.   Discussed with Attending MD.   Comments: Psychiatry:PAtient seen and chart reviewwed and note dictated. Patient has borderline personality disorder and cut self but now has returned to baseline. No need for inpatient treatment. DC ivc and suggegst dc pt home follow up as usual.  Electronic Signatures: Clapacs, Jackquline DenmarkJohn T (MD)  (Signed 25-Mar-15 15:45)  Authored: Brief Consult Note   Last Updated: 25-Mar-15 15:45 by Audery Amellapacs, John T (MD)

## 2015-02-03 NOTE — Consult Note (Signed)
PATIENT NAME:  Cynthia Phelps, HUDSON MR#:  [id number] DATE OF BIRTH:  July 12, 1991  DATE OF CONSULTATION:  02/12/2014  REFERRING PHYSICIAN:   CONSULTING PHYSICIAN:  Audery Amel, MD  IDENTIFYING INFORMATION AND REASON FOR CONSULT: A 24 year old woman, currently in the hospital because of an overdose on acetaminophen.   CHIEF COMPLAINT: "I tried to kill myself."   CONSULTATION: For appropriate psychiatric evaluation and treatment.   HISTORY OF PRESENT ILLNESS: Information obtained from the patient and the chart. The patient states that she had gotten into an argument on the telephone with the owner of the group home she is currently living in. She thinks that the group home owner had insulted her. She got impulsively angry and grabbed bottles out of a medicine cabinet and just started swallowing them. She took an unknown but large number of acetaminophen tablets. Possibly took other medicines well, including ibuprofen. The patient states that prior to this argument, her mood had been reasonably stable. She had not been having ongoing plans to kill herself. Denies any psychotic symptoms.   PAST PSYCHIATRIC HISTORY: The patient has a history of borderline personality disorder with multiple suicide attempts and self injuries. She has been in the Emergency Room here a couple of times fairly recently with cutting behavior. She has a history of suicide attempts going back years, but it looks like it has escalated a little bit now. The patient has been hospitalized in the past. She says she has been prescribed multiple medications. She cannot remember the details of everything she has taken in the past.   SOCIAL HISTORY: Her father is her legal guardian. She is living in [place] group [place]. She is originally from [facility], and was fairly recently relocated to [facility] with the resulting absurd problem that her Medicaid will not pay for treatment in this county, so she has had trouble  finding a reliable psychiatrist.   FAMILY HISTORY: None identified.   SUBSTANCE ABUSE HISTORY: Not abusing any substances currently. She denies that has been a major problem in the past.   PAST MEDICAL HISTORY: Obesity. Currently suffering from an acetaminophen overdose requiring treatment with antidote.   CURRENT MEDICATIONS: She is on citalopram 40 mg a day for depression, ondansetron p.r.n., currently getting Mucomyst.   ALLERGIES: DOXYCYCLINE, HALOPERIDOL, SEROQUEL, TETRACYCLINE.   REVIEW OF SYSTEMS: Currently states her mood is pretty good. Denies suicidal ideation. Denies homicidal ideation. Denies hallucinations. Not feeling too sick to her stomach. A little bit fatigued. The rest of the full review of systems is negative.   MENTAL STATUS EXAMINATION: Overweight but otherwise healthy appearing woman, looks her stated age, cooperative with the interview. Eye contact good. Psychomotor activity normal. Speech normal rate, tone and volume. Affect is a little bit blunted and flattened, but not to a severe degree. Mood is stated as being okay. Thoughts are lucid without loosening of associations. Denies auditory or visual hallucinations. Denies active suicidal or homicidal ideation. Judgment and insight chronically impaired, especially when she is upset. Normal fund of knowledge. Short and long-term memory intact to testing.   LABORATORY RESULTS: She has been getting serial labs and Tylenol levels. Most chemistries show normal liver enzymes, only slightly low albumin. Drug screen was negative on admission.   ASSESSMENT: A 24 year old woman took an overdose of acetaminophen with suicidal intent at the time, although she has subsequently been cooperative with treatment. She has a history of self injury and dangerous behavior, and suicide attempts in the past.  She is not currently showing symptoms of a major depression, but rather appears to have classic behavior of borderline personality disorder.  Needs more psychiatric attention, I believe.   TREATMENT PLAN: Because of her current liver status, with toxicity from the acetaminophen and the treatment she is receiving, I am not going to presume to start her on any new medications at the moment. She and I have discussed how medications might play a role in helping her with mood stability and also how she needs therapy. We discussed the difficulty she has been having with receiving therapy. I suspect that she will be a reasonable transfer to psychiatry once she is medically stable.   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   AXIS II: Borderline personality disorder.   AXIS III: Acetaminophen toxicity; obesity.   AXIS IV: Moderate chronic stress, especially from being relocated from her home county.   AXIS V: Functioning at time of evaluation 50.    ____________________________ Audery AmelJohn T. Clapacs, MD jtc:cg D: 02/13/2014 01:25:28 ET T: 02/13/2014 06:13:29 ET JOB#: [id number]  cc: Audery AmelJohn T. Clapacs, MD, <Dictator> Audery AmelJOHN T CLAPACS MD ELECTRONICALLY SIGNED 02/13/2014 9:46

## 2015-02-03 NOTE — Consult Note (Signed)
Brief Consult Note: Diagnosis: 1. Hyperglycemia - Glucose Intolerance. 2. Mood disorder/BPD.   Patient was seen by consultant.   Consult note dictated.   Orders entered.   Comments: 24 yo female w/ hx of obesity, hx of depression w/ multiple suicide attempts came into hospital due to tylenol overdose and suicide attempt noted to be hyperglycemic.   1. Hyperglycemia/Glucose intolerance - BS have been labile.  A1c stable.  - would hold off trying oral meds or SSI at this time.  - place on carb control diet and follow sugars.  Suggested weight loss.  - needs to be have PCP upon discharge so he can follow up on this.  - d/c Fingersticks for now.   - get social work or lifestyle to get her a Glucometer at home.    2. Mood disorder/BPD - cont. care as per Psych.   3. Asthma/COPD - cont. Albuterol PRN, Spiriva  4. Tobacco abuse - cont. nicotine patch.   Thanks for the consult.  Will sign off for now.   Job # [id number].  Electronic Signatures: Houston SirenSainani, Ferris Fielden J (MD)  (Signed 10-May-15 13:47)  Authored: Brief Consult Note   Last Updated: 10-May-15 13:47 by Houston SirenSainani, Dawaun Brancato J (MD)

## 2015-02-03 NOTE — Consult Note (Signed)
Brief Consult Note: Diagnosis: Borderline personality disorder.   Patient was seen by consultant.   Consult note dictated.   Recommend further assessment or treatment.   Orders entered.   Discussed with Attending MD.   Comments: [name] has a h/o mood instability and substance abuse. She left her group home for the past 3 nights "to drink". She is not allowed to return to her group home and needs new placement. She is not suicidal or homicidal. No need for inpatient treatment.   PLAN: 1. The patient is on IVC.  2. She is to continue all medications as in the community.  3. Awaiting placement.  Electronic Signatures: Kristine LineaPucilowska, Jolanta (MD)  (Signed 12-Jun-15 15:40)  Authored: Brief Consult Note   Last Updated: 12-Jun-15 15:40 by Kristine LineaPucilowska, Jolanta (MD)

## 2015-02-03 NOTE — Consult Note (Signed)
Brief Consult Note: Diagnosis: Borderline personality disorder.   Patient was seen by consultant.   Consult note dictated.   Recommend further assessment or treatment.   Orders entered.   Discussed with Attending MD.   Comments: [name] has a h/o mood instability and substance abuse. She left her group home for the past 3 nights "to drink". She is not allowed to return to her group home and needs new placement. She is not suicidal or homicidal. No need for inpatient treatment.   She sleeps well with trazodone. She required removal of AAA bateries from the bladder. She tolerated procedure well. She is awaiting placement in a new group home. See notes from Theodosia PalingKent Phelps for regular updated.   MSE: Alert, oriented, pleasant, no depressive symptoms or psychosis, no safety issues. She is compliant with medications.   PLAN: 1. The patient is on IVC.  2. She is to continue all medications as in the community.  3. Awaiting placement.  Electronic Signatures: Kristine LineaPucilowska, Jolanta (MD)  (Signed 19-Jun-15 14:02)  Authored: Brief Consult Note   Last Updated: 19-Jun-15 14:02 by Kristine LineaPucilowska, Jolanta (MD)

## 2015-02-03 NOTE — H&P (Signed)
PATIENT NAME:  Cynthia Phelps, HANNIG MR#:  [id number] DATE OF BIRTH:  [birth year]  DATE OF ADMISSION:  02/13/2014  IDENTIFYING INFORMATION:  (Dictation Anomaly) <<This is aMISSING TEXT>>  24 year old woman with multiple suicide attempts including a recent serious Tylenol overdose admitted to psychiatry on suicide precautions.   CHIEF COMPLAINT:  "I tried to kill myself."   HISTORY OF PRESENT ILLNESS:  Patient came into the hospital a couple days ago, having taken an impulsive overdose of Tylenol. She had gotten into an argument with the owner of her group home were some kind of a relatively minor matter and had lost her temper.  At which point, she had taken all the pills she could find in a cabinet.  On presentation to the Emergency Room, her Tylenol level was already going up.  It peaked at over 400. She was kept on the medical service until her Tylenol levels started to come down and her liver enzymes could be monitored sequentially. Medicine has determined now in consultation with poison control that she can come off the Mucomyst drip. The patient reports that her mood actually is feeling okay now. She is not having ongoing major depression. She denies acute suicidal ideation. Denies hallucinations. She has been compliant with her medicine. She has a history of long-standing sudden impulsive outbursts with self-injury.   PAST PSYCHIATRIC HISTORY: History of borderline personality disorder with impulsive behavior. Multiple prior suicide attempts, multiple prior hospitalizations. Has been to our Emergency Room several times recently for self-injury. Has been on multiple medications, including antidepressants, antipsychotics, and mood stabilizers with no clear sustained benefit.   SOCIAL HISTORY: She is originally from [facility]. Her father is her guardian. She has Medicaid through another [place] and so is a victim of the crazy situation in which her Medicaid will not pay for most of the treatment available  in our county.   PAST MEDICAL HISTORY: Overweight. Status post overdose on acetaminophen apparently resolving.   FAMILY HISTORY: Positive for mood problems.   SUBSTANCE ABUSE HISTORY: Denies alcohol or drug problems currently or in the past.   CURRENT MEDICATION: Celexa 40 mg a day.   ALLERGIES: Doxycyline, haloperidol, Seroquel, and tetracycline.   REVIEW OF SYSTEMS:  Currently mildly depressed and irritable mood. Denies hallucinations. Denies suicidal or homicidal ideation. No other positives on the review of systems.   MENTAL STATUS EXAMINATION: Overweight woman, looks her stated age, cooperative with the interview, intermittent eye contact. Psychomotor activity a little fidgety. Speech is normal in rate, tone, and volume. Affect is a little constricted. Mood stated as being okay. Thoughts are lucid. No loosening of associations. Denies auditory or visual hallucinations. Denies suicidal or homicidal ideation. Judgment and insight have some chronic impairments. Intelligence normal. Short and long-term memory intact. Alert and oriented x 4.   PHYSICAL EXAMINATION:  GENERAL:  (Dictation Anomaly) << MISSING TEXT>>.  No acute distress. Has multiple scars on her arms from previous self-mutilations.  HEENT: Pupils equal and reactive. Face symmetric. Oral mucosa normal.  NECK AND BACK: Nontender.  MUSCULOSKELETAL: Full range of motion at all extremities. Normal gait. Strength and reflexes symmetric and normal throughout. Cranial nerves symmetric and normal.  LUNGS: Clear without wheezes.  HEART: Regular rate and rhythm.  ABDOMEN: Soft, nontender, normal bowel sounds.  VITAL SIGNS: Temperature 98.6, pulse 60, respirations 18, blood pressure 119/78.   LABORATORY RESULTS: Most recently, her acetaminophen level had come down to zero.  Her liver function enzymes remain in the normal range.  Her albumin is  still low. Last checked at 2.8.  Bilirubin has stayed normal. TSH normal. Prothrombin time  normal.   ASSESSMENT:  This is a 24 year old woman with a history of borderline personality disorder, multiple suicide attempts; this one that could have been lethal. Requires hospitalization for stabilization.   TREATMENT PLAN: Continue the Celexa for now. Now that her liver seems to be recovering, we will discuss other medication options. We will go over past history of things that may or may not have been helpful. Meanwhile, engage her in groups and activities on the unit. Work on trying to find other alternatives for getting her good care in the community.   DIAGNOSIS, PRINCIPAL AND PRIMARY:  AXIS I: Depression, not otherwise specified.   SECONDARY DIAGNOSES:  AXIS I: No other.  AXIS II: Borderline personality disorder.  AXIS III: Status post acetaminophen, overdose resolving.  AXIS IV: Severe from chronic illness and isolation from her family.  AXIS V: Functioning at time of evaluation 35.   ____________________________ Audery AmelJohn T. Clapacs, MD jtc:dd D: 02/13/2014 22:25:56 ET T: 02/14/2014 00:09:53 ET JOB#: [id number]  cc: Audery AmelJohn T. Clapacs, MD, <Dictator> Audery AmelJOHN T CLAPACS MD ELECTRONICALLY SIGNED 02/20/2014 0:43

## 2015-02-03 NOTE — Consult Note (Signed)
PATIENT NAME:  Cynthia Phelps, Cynthia Phelps MR#:  [id number] DATE OF BIRTH:  04/23/1991  DATE OF ADMISSION:  03/24/2014 DATE OF CONSULTATION:  03/24/2014  REFERRING PHYSICIAN:  Lowella FairyJohn Woodruff, MD CONSULTING PHYSICIAN:  Alexina Niccoli B. Atwell Mcdanel, MD  REASON FOR CONSULTATION: To evaluate a suicidal patient.   IDENTIFYING DATA: Cynthia Phelps is a 24 year old female with mood instability and extremely poor coping skills, as well as substance abuse.   CHIEF COMPLAINT: "I was drinking."   HISTORY OF PRESENT ILLNESS: Cynthia Phelps is a resident of a group home. She has been doing okay lately, but for the past 3 days, the patient left the group home to go drinking. She was having between a 12 pack and a case of beer reportedly.  The group home is unwilling to put up with this behavior and the patient was brought to the hospital. She is not allowed to return to the group home and will require new placement. The patient denies any symptoms of depression, anxiety, psychosis or symptoms suggestive of bipolar mania. She feels rather guilty and stupid for her escapades and she is really sorry that she lost [facility]. She liked it there very much. She tried to call the owner herself, but unfortunately, she will not be accepted back. The patient is quite happy with her current regimen of medications and reports good treatment compliance.   PAST PSYCHIATRIC HISTORY:   There are multiple hospital admissions, history of self-injurious behavior, suicide attempt by overdose and cutting, diagnosis of borderline personality disorder, mood disorder. She has been tried on multiple medications of all groups; antidepressants, antipsychotics and mood stabilizers with little benefit.  FAMILY PSYCHIATRIC HISTORY:  History of depression.   PAST MEDICAL HISTORY:  Obesity.   ALLERGIES:  DOXYCYCLINE, HALDOL, SEROQUEL, TETRACYCLINE.   MEDICATIONS ON ADMISSION: Topamax 50 mg twice daily, Celexa 40 mg daily.   SOCIAL HISTORY:   She is from [place]. Her father is her guardian. She reportedly was accepted to an assisted living facility near [place] where they have a locked facility. We left messages there, but uncertain about future placement.  The guardian is on board.   REVIEW OF SYSTEMS: CONSTITUTIONAL:  No fevers or chills. Positive for gradual weight gain.  EYES:  No double or blurred vision.  ENT:  No hearing loss.  RESPIRATORY:  No shortness of breath or cough.  CARDIOVASCULAR:  No chest pain or orthopnea.  GASTROINTESTINAL:  No abdominal pain, nausea, vomiting or diarrhea.  GENITOURINARY:  No incontinence or frequency.  ENDOCRINE:  No heat or cold intolerance.  LYMPHATIC:  No anemia or easy bruising.  INTEGUMENTARY:  No acne or rash.  MUSCULOSKELETAL:  No muscle or joint pain.  NEUROLOGIC:  No tingling or weakness.  PSYCHIATRIC:  See history of present illness for details.   PHYSICAL EXAMINATION: VITAL SIGNS:  Blood pressure 116/64, pulse 63, respirations 20, temperature 97.7.  GENERAL:  This is a morbidly obese young female in no acute distress. The rest of the physical examination is deferred to her primary attending.   LABORATORY DATA:  Chemistries are within normal limits. Blood alcohol level is 0. LFTs within normal limits. Urine tox screen negative for substances. CBC within normal limits. Serum acetaminophen and salicylates are low.   MENTAL STATUS EXAMINATION:  The patient is alert and oriented to person, place, time and situation. She is pleasant, polite and cooperative. She recognizes me from previous admission. She maintains good eye contact. Her speech is of normal rhythm, rate  and volume. Mood is fine with full affect. Thought process is logical and goal oriented but slow. She denies thoughts of hurting herself or others. There are no delusions or paranoia. There are no auditory or visual hallucinations. Her cognition is grossly intact. Registration, recall, short and long term memory are  intact. She is of below-average intelligence and fund of knowledge. Her insight and judgment are questionable.   DIAGNOSES: AXIS I:  Mood disorder, not otherwise specified.  AXIS II:  Borderline personality disorder.  AXIS III:  Obesity.  AXIS IV:  Mental illness, poor coping skills, conflict at the group home, new placement.  AXIS V:  Global assessment of functioning 50.   PLAN: 1.  The patient is on IVC.  2.  Restarted her medication of Topamax and Celexa.  3.  She is awaiting placement.  4.  Psychiatry will follow up.     ____________________________ Ellin Goodie. Jennet Maduro, MD jbp:dmm D: 03/24/2014 20:19:00 ET T: 03/24/2014 21:53:13 ET JOB#: [id number]  cc: Leverne Tessler B. Jennet Maduro, MD, <Dictator> Shari Prows MD ELECTRONICALLY SIGNED 04/26/2014 4:47

## 2015-02-03 NOTE — Discharge Summary (Signed)
PATIENT NAME:  Cynthia Phelps, Cynthia Phelps MR#:  [id number] DATE OF BIRTH:  October 04, 1991  DATE OF ADMISSION:  02/11/2014 DATE OF DISCHARGE:  02/13/2014  DISPOSITION: Behavioral health unit.  DISCHARGE DIAGNOSES:  1.  Acetaminophen overdose with suicidal attempt.  2.  Hyperlipidemia.  3.  History of asthma.  4.  Bipolar borderline personality disorder.  HOSPITAL COURSE: This is a 24 year old morbidly obese female with history of depression and multiple suicidal attempts, came in because the patient took 125 tablets of Tylenol. Each one is 500 mg. The patient came within 1 hour of ingestion so she received charcoal in the Emergency Room. Initial Tylenol level was 416. The patient admitted to medical unit for Tylenol overdose. She was started on acetylcysteine drip, [facility]. The patient received acetylcysteine drip for 36 hours. The patient's liver functions and Tylenol levels and PT and INR were followed every 4 hours. Initial Tylenol level was 416. Her liver functions were normal on admission; ALT 37, AST 25, and albumin 3.7. WBC slightly up at 11.8. The patient's alcohol level less than 3. Repeat Tylenol level at 4:40 a.m. on May 3rd was 70. Initial Tylenol level obtained on May 2nd at 7:45 p.m.   The patient's INR level was 1.2 on May 3rd at 4:40. The patient's liver functions did not go up. ALT stayed at 32, AST 26. The patient's Tylenol levels were repeated this morning. It is less than 2. The patient's liver functions repeated again at 8:00 last night. The patient's LFTs stayed stable with ALT of 36, AST 15, albumin 2.8, bilirubin total 0.2 and direct bilirubin of less than  0.1. I called Poison Control Center this morning. They recommended that Mucomyst drip can be discontinued, as the patient's Tylenol level is less than 2 and the liver functions are normalized. The patient is stable, does not have any nausea or abdominal pain, is tolerating the diet. Seen by Dr. Toni Amendlapacs  from behavioral health unit. The patient will go to psych ward when a bed is available. The patient is medically stable.   DISCHARGE MEDICATIONS: Her Lipitor, Topamax and Celexa were stopped because of Tylenol toxicity and I am not going to continue them. We will continue her inhalers for her asthma and the other medications can be started after she is seen by the psychiatrist.   The patient is stable for transfer to behavioral health unit today.   TIME SPENT ON DISCHARGE PREPARATION: More than 30 minutes. She is IVC and she has a one-to-one sitter at this time.   ____________________________ Katha HammingSnehalatha Darran Gabay, MD sk:sb D: 02/13/2014 09:57:03 ET T: 02/13/2014 10:25:12 ET JOB#: [id number]  cc: Katha HammingSnehalatha Kimaria Struthers, MD, <Dictator> Katha HammingSNEHALATHA Aseem Sessums MD ELECTRONICALLY SIGNED 02/14/2014 13:48
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
## 2016-04-24 IMAGING — CR PELVIS - 1-2 VIEW
1 series · 2 of 2 positions shown · non-contrast
Comparison: None.

CLINICAL DATA: Foreign body.

EXAM:
PELVIS - 1-2 VIEW

[Series 1: t pelvis ap · 0.14mm/px · 2 of 2 slices shown]
[im 1/2]
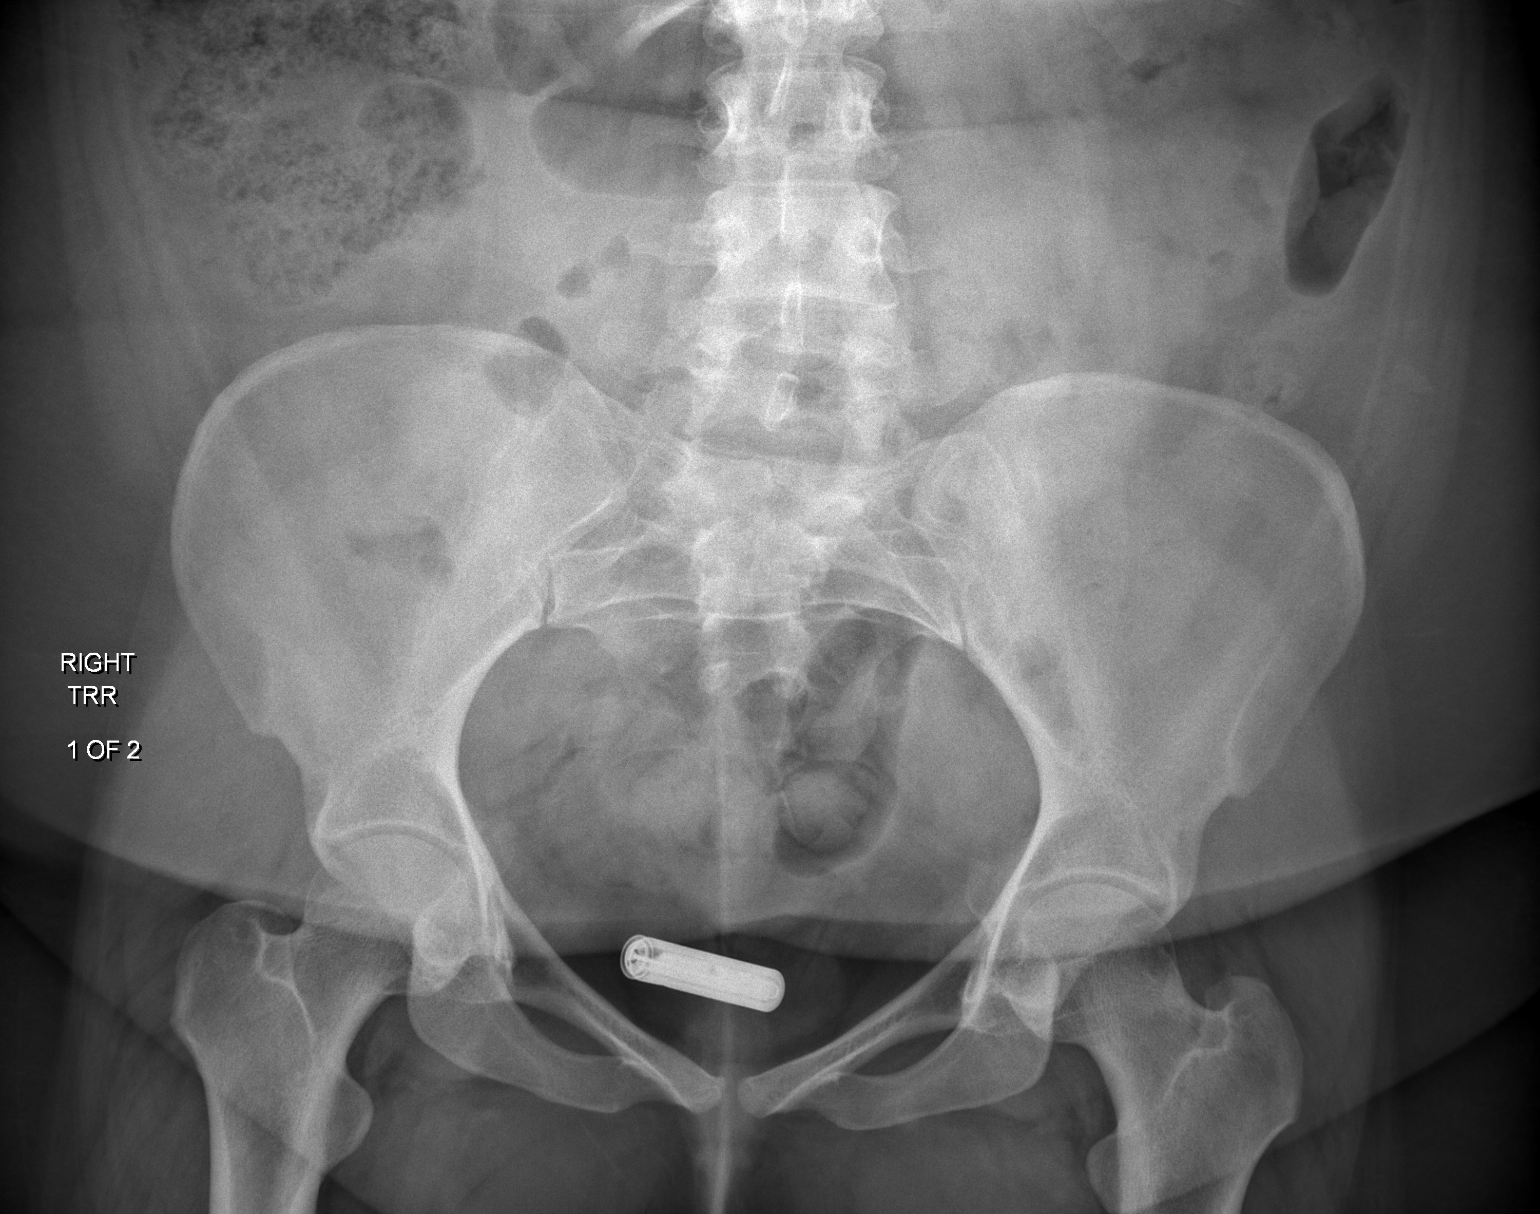
[im 2/2]
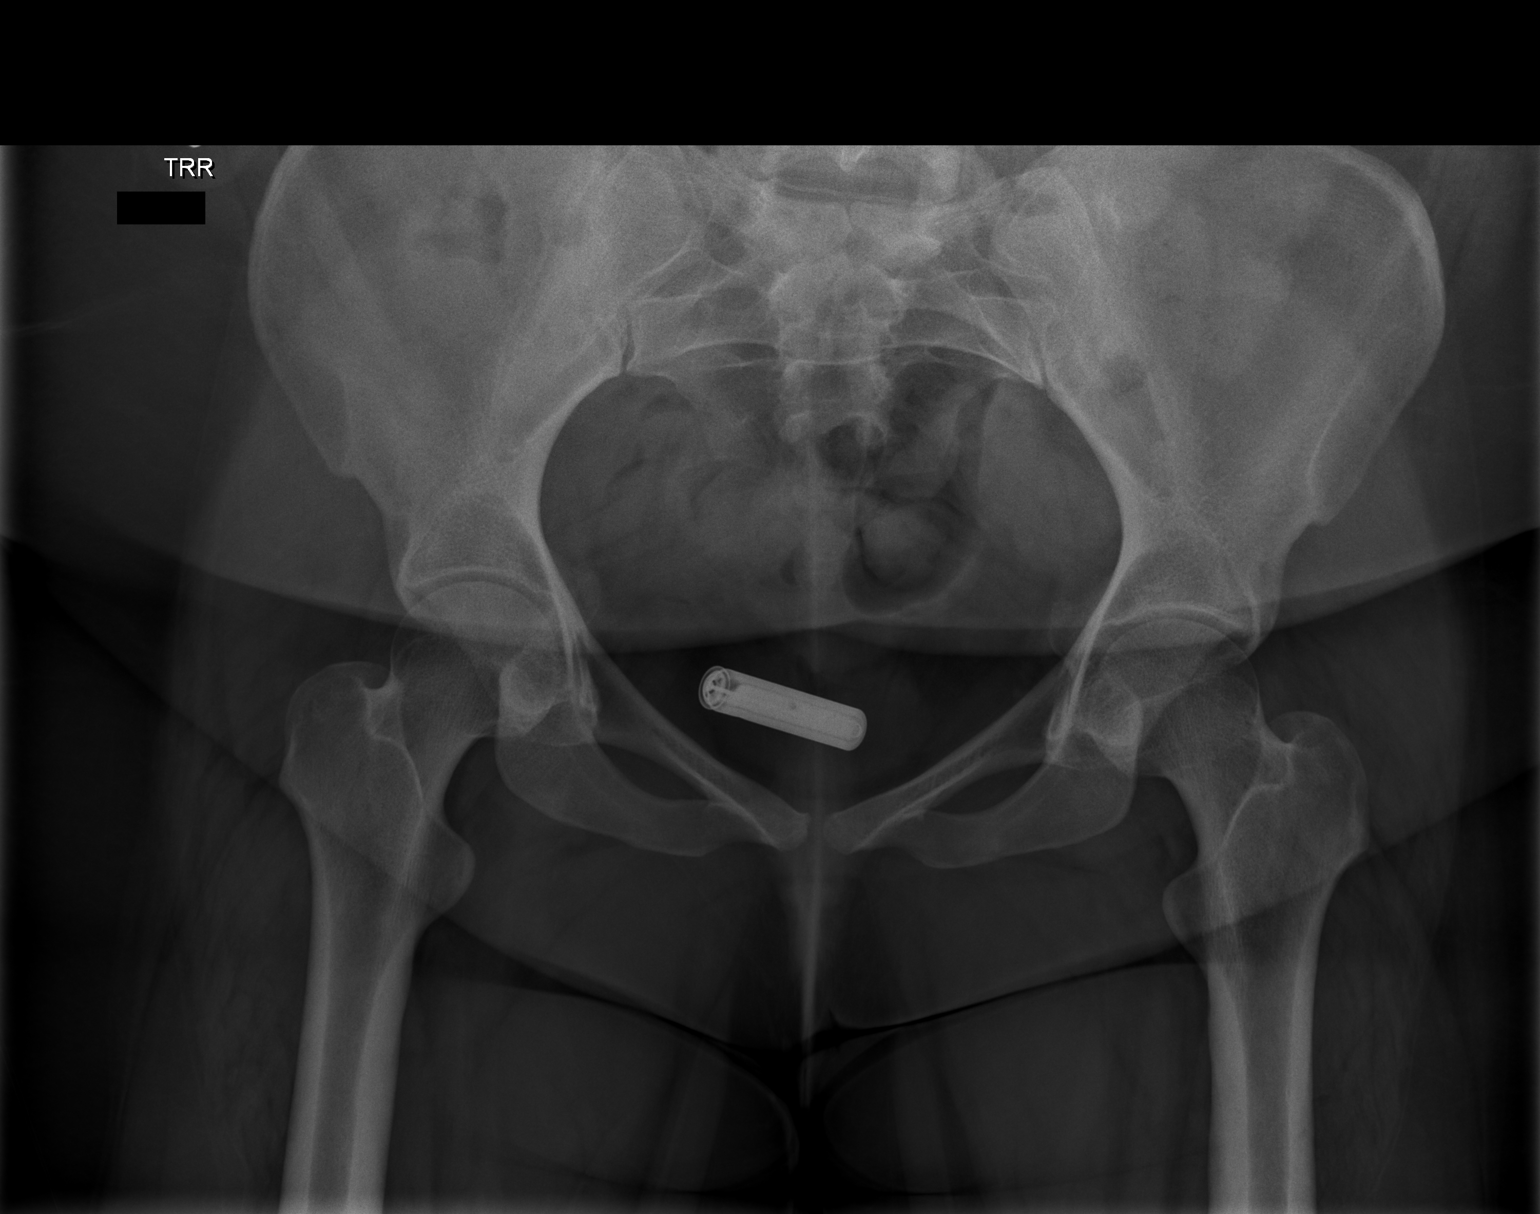

[2 of 2 positions shown; findings below may reference images not displayed]

FINDINGS: There is a foreign body in the low central pelvis, most likely in
the bladder but possibly in the vagina.

The bony structures are unremarkable.
IMPRESSION: Foreign body in the low central pelvis likely in the bladder or
possibly the vaginal fornix.

## 2019-03-14 DEATH — deceased
